# Patient Record
Sex: Female | Born: 1975 | Race: White | Hispanic: No | Smoking: Never smoker
Health system: Southern US, Community
[De-identification: ages and names within clinical notes are randomized; demographics above are authoritative.]

## PROBLEM LIST (undated history)

## (undated) DIAGNOSIS — R61 Generalized hyperhidrosis: Secondary | ICD-10-CM

## (undated) DIAGNOSIS — D259 Leiomyoma of uterus, unspecified: Secondary | ICD-10-CM

## (undated) DIAGNOSIS — T8859XA Other complications of anesthesia, initial encounter: Secondary | ICD-10-CM

## (undated) DIAGNOSIS — G43909 Migraine, unspecified, not intractable, without status migrainosus: Secondary | ICD-10-CM

## (undated) DIAGNOSIS — F419 Anxiety disorder, unspecified: Secondary | ICD-10-CM

## (undated) DIAGNOSIS — Z789 Other specified health status: Secondary | ICD-10-CM

## (undated) DIAGNOSIS — N921 Excessive and frequent menstruation with irregular cycle: Secondary | ICD-10-CM

## (undated) HISTORY — PX: OTHER SURGICAL HISTORY: SHX169

## (undated) HISTORY — DX: Other specified health status: Z78.9

---

## 2003-02-25 HISTORY — PX: DILATION AND CURETTAGE OF UTERUS: SHX78

## 2004-01-29 ENCOUNTER — Ambulatory Visit: Payer: Self-pay | Admitting: Obstetrics & Gynecology

## 2004-03-21 ENCOUNTER — Inpatient Hospital Stay: Payer: Self-pay | Admitting: Obstetrics & Gynecology

## 2005-02-15 ENCOUNTER — Emergency Department: Payer: Self-pay | Admitting: Emergency Medicine

## 2006-07-14 ENCOUNTER — Inpatient Hospital Stay: Payer: Self-pay | Admitting: Unknown Physician Specialty

## 2009-02-24 HISTORY — PX: LEEP: SHX91

## 2009-06-25 LAB — HEPATIC FUNCTION PANEL
ALK PHOS: 62 U/L (ref 25–125)
ALT: 13 U/L (ref 7–35)
AST: 13 U/L (ref 13–35)
Bilirubin, Total: 2.5 mg/dL

## 2009-06-25 LAB — BASIC METABOLIC PANEL
BUN: 13 mg/dL (ref 4–21)
CREATININE: 0.6 mg/dL (ref 0.5–1.1)
Glucose: 94 mg/dL
Potassium: 4 mmol/L (ref 3.4–5.3)
SODIUM: 140 mmol/L (ref 137–147)

## 2009-06-25 LAB — CBC AND DIFFERENTIAL
HCT: 41 % (ref 36–46)
HEMOGLOBIN: 13.3 g/dL (ref 12.0–16.0)
Neutrophils Absolute: 4 /uL
Platelets: 193 10*3/uL (ref 150–399)
WBC: 5.8 10^3/mL

## 2009-07-03 ENCOUNTER — Ambulatory Visit: Payer: Self-pay | Admitting: Family Medicine

## 2015-06-26 ENCOUNTER — Other Ambulatory Visit: Payer: Self-pay | Admitting: Family Medicine

## 2015-09-06 DIAGNOSIS — F419 Anxiety disorder, unspecified: Secondary | ICD-10-CM | POA: Insufficient documentation

## 2015-09-18 ENCOUNTER — Other Ambulatory Visit: Payer: Self-pay | Admitting: Family Medicine

## 2015-09-18 NOTE — Telephone Encounter (Signed)
Let her know I have sent in a refill. Will need an office visit prior to next refill.

## 2015-09-18 NOTE — Telephone Encounter (Signed)
Pt requesting a refill on the following medication, pt use's [facility]  In [place].  Pt states she will be going out of town tomorrow was wondering if this can be ready before she goes out of town or pt would like to know if she could get this refilled at a [facility] where her is going.  Thanks CC   escitalopram (LEXAPRO) 20 MG tablet

## 2015-12-11 ENCOUNTER — Encounter: Payer: Self-pay | Admitting: Family Medicine

## 2015-12-11 ENCOUNTER — Ambulatory Visit (INDEPENDENT_AMBULATORY_CARE_PROVIDER_SITE_OTHER): Payer: BC Managed Care – PPO | Admitting: Family Medicine

## 2015-12-11 VITALS — BP 100/60 | HR 70 | Temp 97.9°F | Resp 16 | Wt 144.6 lb

## 2015-12-11 DIAGNOSIS — F419 Anxiety disorder, unspecified: Secondary | ICD-10-CM

## 2015-12-11 MED ORDER — ESCITALOPRAM OXALATE 20 MG PO TABS: 20.0000 mg | ORAL_TABLET | Freq: Every day | ORAL | 3 refills | Status: DC

## 2015-12-11 MED ORDER — ALPRAZOLAM 0.5 MG PO TABS: 0.5000 mg | ORAL_TABLET | Freq: Every day | ORAL | 1 refills | Status: DC

## 2015-12-11 NOTE — Progress Notes (Signed)
Subjective:     Patient ID: Alisha Hamilton, female   DOB: [birth year], 40 y.o.   MRN: [id number]  HPI  Chief Complaint  Patient presents with  . Anxiety    Patient comes in office today for follow up anxiety, patient states that she was last seen in office for matter in 09/2014. Patient reports good compliance and tolerance while on Lexapro.   States she rarely uses the Xanax once daily. Generally defers health maintenance including flu shot today. 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   Review of Systems  Skin:       History of dystrophic nail which did not improve with Penlac or Vick's. Wishes to try another topical.       Objective:   Physical Exam  Constitutional: She appears well-developed and well-nourished. No distress.  Psychiatric: She has a normal mood and affect. Her behavior is normal.       Assessment:    1. Anxiety - escitalopram (LEXAPRO) 20 MG tablet; Take 1 tablet (20 mg total) by mouth daily.  Dispense: 90 tablet; Refill: 3 - ALPRAZolam (XANAX) 0.5 MG tablet; Take 1 tablet (0.5 mg total) by mouth daily. As needed for anxiety  Dispense: 30 tablet; Refill: 1    Plan:      Discussed trial of [place] for her nails.

## 2015-12-11 NOTE — Patient Instructions (Signed)
Try [place] Tea Tree Oil for your nails.

## 2016-10-07 ENCOUNTER — Encounter: Payer: Self-pay | Admitting: Obstetrics & Gynecology

## 2016-10-07 ENCOUNTER — Ambulatory Visit (INDEPENDENT_AMBULATORY_CARE_PROVIDER_SITE_OTHER): Payer: BC Managed Care – PPO | Admitting: Obstetrics & Gynecology

## 2016-10-07 VITALS — BP 120/80 | HR 80 | Ht 67.0 in | Wt 149.0 lb

## 2016-10-07 DIAGNOSIS — Z1239 Encounter for other screening for malignant neoplasm of breast: Secondary | ICD-10-CM

## 2016-10-07 DIAGNOSIS — Z131 Encounter for screening for diabetes mellitus: Secondary | ICD-10-CM

## 2016-10-07 DIAGNOSIS — Z13220 Encounter for screening for lipoid disorders: Secondary | ICD-10-CM

## 2016-10-07 DIAGNOSIS — Z87410 Personal history of cervical dysplasia: Secondary | ICD-10-CM | POA: Diagnosis not present

## 2016-10-07 DIAGNOSIS — Z1321 Encounter for screening for nutritional disorder: Secondary | ICD-10-CM | POA: Diagnosis not present

## 2016-10-07 DIAGNOSIS — Z0000 Encounter for general adult medical examination without abnormal findings: Secondary | ICD-10-CM | POA: Diagnosis not present

## 2016-10-07 DIAGNOSIS — Z1231 Encounter for screening mammogram for malignant neoplasm of breast: Secondary | ICD-10-CM | POA: Diagnosis not present

## 2016-10-07 DIAGNOSIS — Z1329 Encounter for screening for other suspected endocrine disorder: Secondary | ICD-10-CM | POA: Diagnosis not present

## 2016-10-07 NOTE — Patient Instructions (Signed)
PAP every 2-3 years if normal Mammogram every year    Call [phone] to schedule at [facility]) Labs yearly to every 3 years

## 2016-10-07 NOTE — Progress Notes (Signed)
HPI:      Alisha Hamilton is a 41 y.o. [id number] who LMP was Patient's last menstrual period was 09/23/2016., she presents today for her annual examination. The patient has no complaints today. The patient is sexually active. Her last pap: approximate date 2016 and was normal and prior h/o LEEP 2011. The patient does perform self breast exams.  There is no notable family history of breast or ovarian cancer in her family.  The patient has regular exercise: yes.  The patient denies current symptoms of depression.    GYN History: Contraception: IUD  PMHx: History reviewed. No pertinent past medical history. Past Surgical History:  Procedure Laterality Date  . DILATION AND CURETTAGE OF UTERUS  2005  . LEEP  2011  . OTHER SURGICAL HISTORY     clips put on nerves 2006   Family History  Problem Relation Age of Onset  . Diabetes Father   . Anxiety disorder Sister   . Irritable bowel syndrome Sister   . Alcohol abuse Brother   . Hypertension Brother   . Hyperlipidemia Brother   . Anxiety disorder Brother    Social History  Substance Use Topics  . Smoking status: Never Smoker  . Smokeless tobacco: Never Used  . Alcohol use Yes    Current Outpatient Prescriptions:  .  ALPRAZolam (XANAX) 0.5 MG tablet, Take 1 tablet (0.5 mg total) by mouth daily. As needed for anxiety, Disp: 30 tablet, Rfl: 1 .  escitalopram (LEXAPRO) 20 MG tablet, Take 1 tablet (20 mg total) by mouth daily., Disp: 90 tablet, Rfl: 3 .  ferrous fumarate (FERRO-SEQUELS) 18 MG CR tablet, Take by mouth., Disp: , Rfl:  Allergies: Patient has no known allergies.  Review of Systems  Constitutional: Negative for chills, fever and malaise/fatigue.  HENT: Negative for congestion, sinus pain and sore throat.   Eyes: Negative for blurred vision and pain.  Respiratory: Negative for cough and wheezing.   Cardiovascular: Negative for chest pain and leg swelling.  Gastrointestinal: Negative for abdominal pain, constipation,  diarrhea, heartburn, nausea and vomiting.  Genitourinary: Negative for dysuria, frequency, hematuria and urgency.  Musculoskeletal: Negative for back pain, joint pain, myalgias and neck pain.  Skin: Negative for itching and rash.  Neurological: Negative for dizziness, tremors and weakness.  Endo/Heme/Allergies: Does not bruise/bleed easily.  Psychiatric/Behavioral: Negative for depression. The patient is not nervous/anxious and does not have insomnia.     Objective: BP 120/80   Pulse 80   Ht 5\' 7"  (1.702 m)   Wt 149 lb (67.6 kg)   LMP 09/23/2016   BMI 23.34 kg/m   Filed Weights   10/07/16 0817  Weight: 149 lb (67.6 kg)   Body mass index is 23.34 kg/m. Physical Exam  Constitutional: She is oriented to person, place, and time. She appears well-developed and well-nourished. No distress.  Genitourinary: Rectum normal, vagina normal and uterus normal. Pelvic exam was performed with patient supine. There is no rash or lesion on the right labia. There is no rash or lesion on the left labia. Vagina exhibits no lesion. No bleeding in the vagina. Right adnexum does not display mass and does not display tenderness. Left adnexum does not display mass and does not display tenderness. Cervix does not exhibit motion tenderness, lesion, friability or polyp.   Uterus is mobile and anteverted. Uterus is not enlarged or exhibiting a mass.  Genitourinary Comments: IUD strings 1 cm  HENT:  Head: Normocephalic and atraumatic. Head is without laceration.  Right  Ear: Hearing normal.  Left Ear: Hearing normal.  Nose: No epistaxis.  No foreign bodies.  Mouth/Throat: Uvula is midline, oropharynx is clear and moist and mucous membranes are normal.  Eyes: Pupils are equal, round, and reactive to light.  Neck: Normal range of motion. Neck supple. No thyromegaly present.  Cardiovascular: Normal rate and regular rhythm.  Exam reveals no gallop and no friction rub.   No murmur heard. Pulmonary/Chest: Effort  normal and breath sounds normal. No respiratory distress. She has no wheezes. Right breast exhibits no mass, no skin change and no tenderness. Left breast exhibits no mass, no skin change and no tenderness.  Abdominal: Soft. Bowel sounds are normal. She exhibits no distension. There is no tenderness. There is no rebound.  Musculoskeletal: Normal range of motion.  Neurological: She is alert and oriented to person, place, and time. No cranial nerve deficit.  Skin: Skin is warm and dry.  Psychiatric: She has a normal mood and affect. Judgment normal.  Vitals reviewed.   Assessment:  ANNUAL EXAM 1. Annual physical exam   2. Screening for breast cancer   3. History of cervical dysplasia   4. Screening for thyroid disorder   5. Screening for diabetes mellitus   6. Screening for cholesterol level   7. Encounter for vitamin deficiency screening      Screening Plan:            1.  Cervical Screening-  Pap smear done today  2. Breast screening- Exam annually and mammogram>40 planned   3. Colonoscopy every 10 years, Hemoccult testing - after age 76  4. Labs To return fasting at a later date  5. Counseling for contraception: IUD  Other:  1. Annual physical exam  2. Screening for breast cancer - MM DIGITAL SCREENING BILATERAL; Future  3. History of cervical dysplasia - IGP, Aptima HPV  4. Screening for thyroid disorder - TSH; Future  5. Screening for diabetes mellitus - Glucose, fasting; Future  6. Screening for cholesterol level - Lipid panel; Future  7. Encounter for vitamin deficiency screening - VITAMIN D 25 Hydroxy (Vit-D Deficiency, Fractures); Future  8. Needs IUD exchange as has been 5 years w Mirena; min periods, no desire for pregnancy     F/U  Return in about 3 weeks (around 10/28/2016) for IUD APPT, Labs.  Barnett Applebaum, MD, [facility], [facility] 10/07/2016  8:43 AM

## 2016-10-10 LAB — IGP, APTIMA HPV
HPV APTIMA: NEGATIVE
PAP Smear Comment: 0

## 2016-12-02 ENCOUNTER — Other Ambulatory Visit: Payer: Self-pay | Admitting: Family Medicine

## 2016-12-02 DIAGNOSIS — F419 Anxiety disorder, unspecified: Secondary | ICD-10-CM

## 2016-12-02 NOTE — Telephone Encounter (Signed)
Please schedule her to come in for an office visit prior to refilling medication

## 2016-12-02 NOTE — Telephone Encounter (Signed)
[id number] 

## 2016-12-08 ENCOUNTER — Telehealth: Payer: Self-pay | Admitting: Family Medicine

## 2016-12-08 ENCOUNTER — Encounter: Payer: Self-pay | Admitting: Family Medicine

## 2016-12-08 ENCOUNTER — Ambulatory Visit (INDEPENDENT_AMBULATORY_CARE_PROVIDER_SITE_OTHER): Payer: BC Managed Care – PPO | Admitting: Family Medicine

## 2016-12-08 DIAGNOSIS — Z23 Encounter for immunization: Secondary | ICD-10-CM | POA: Diagnosis not present

## 2016-12-08 DIAGNOSIS — F419 Anxiety disorder, unspecified: Secondary | ICD-10-CM

## 2016-12-08 MED ORDER — ALPRAZOLAM 0.5 MG PO TABS: 0.5000 mg | ORAL_TABLET | Freq: Every day | ORAL | 0 refills | Status: DC

## 2016-12-08 MED ORDER — ESCITALOPRAM OXALATE 20 MG PO TABS: 20.0000 mg | ORAL_TABLET | Freq: Every day | ORAL | 3 refills | Status: DC

## 2016-12-08 NOTE — Progress Notes (Signed)
Subjective:     Patient ID: Alisha Hamilton, female   DOB: [birth year], 41 y.o.   MRN: [id number]  HPI  Chief Complaint  Patient presents with  . Annual Exam    Patient comes in office today for her annual physical she states she is feeling well and has no questions or concerns to address. Patient reports she follows a well balanced diet, sleeping on average 7hrs a night and is not actively exericising. Patient states that she does monthly self breast checks and there has been no changes. Most recent pap was 10/07/16, mammogram and labs were ordered on same date but patient plans on having those done after christmas holiday. Patient declined flu vaccine today.   On review of the chart she has had prior complete physical with ob-gyn in August and is pending labs. She wishes refill on medication for anxiety. Rarely uses the Xanax except for high stress situations like presentations. After further discussion agrees to get influenza vaccine. Currently is a fourth grade teacher.  Review of Systems     Objective:   Physical Exam  Constitutional: She appears well-developed and well-nourished. No distress.  Psychiatric: She has a normal mood and affect. Her behavior is normal.       Assessment:    1. Anxiety - escitalopram (LEXAPRO) 20 MG tablet; Take 1 tablet (20 mg total) by mouth daily.  Dispense: 90 tablet; Refill: 3 - ALPRAZolam (XANAX) 0.5 MG tablet; Take 1 tablet (0.5 mg total) by mouth daily. As needed for anxiety  Dispense: 30 tablet; Refill: 0  2. Need for influenza vaccination - Flu Vaccine QUAD 36+ mos IM    Plan:    Further f/u when labs completed

## 2016-12-08 NOTE — Patient Instructions (Signed)
Let me know when you get your labs done so I can review them.

## 2016-12-08 NOTE — Telephone Encounter (Signed)
Pt contacted office for refill request on the following medications:  escitalopram (LEXAPRO) 20 MG tablet.  Take 1 tablet by mouth daily.  90 day supply.  [facility] [place].

## 2017-06-24 ENCOUNTER — Other Ambulatory Visit: Payer: Self-pay | Admitting: Obstetrics & Gynecology

## 2017-06-24 ENCOUNTER — Telehealth: Payer: Self-pay | Admitting: Obstetrics & Gynecology

## 2017-06-24 DIAGNOSIS — Z1231 Encounter for screening mammogram for malignant neoplasm of breast: Secondary | ICD-10-CM

## 2017-06-24 NOTE — Telephone Encounter (Signed)
Patient is schedule 06/25/17 with [facility] for mirena removal and reinsertion

## 2017-06-24 NOTE — Telephone Encounter (Signed)
Noted. Shipment to arrive 06/25/17 ~noon-2pm

## 2017-06-25 ENCOUNTER — Ambulatory Visit: Payer: BC Managed Care – PPO | Admitting: Obstetrics & Gynecology

## 2017-06-25 ENCOUNTER — Encounter: Payer: Self-pay | Admitting: Obstetrics & Gynecology

## 2017-06-25 VITALS — BP 112/80 | HR 70 | Ht 67.0 in | Wt 145.0 lb

## 2017-06-25 DIAGNOSIS — Z30433 Encounter for removal and reinsertion of intrauterine contraceptive device: Secondary | ICD-10-CM

## 2017-06-25 DIAGNOSIS — N926 Irregular menstruation, unspecified: Secondary | ICD-10-CM

## 2017-06-25 LAB — POCT URINE PREGNANCY: Preg Test, Ur: NEGATIVE

## 2017-06-25 NOTE — Progress Notes (Signed)
  History of Present Illness:  Alisha Hamilton is a 42 y.o. that had a Mirena IUD placed approximately 5 years ago. Since that time, she states that some BTB as it has been > 5 years.  Desires another IUD.  The following portions of the patient's history were reviewed and updated as appropriate: allergies, current medications, past family history, past medical history, past social history, past surgical history and problem list.  Patient Active Problem List   Diagnosis Date Noted  . History of cervical dysplasia 10/07/2016  . Anxiety 09/06/2015  . Acute onset aura migraine 08/04/2009  . 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 ALPRAZolam (XANAX) 0.5 MG tablet Take 1 tablet (0.5 mg total) by mouth daily. As needed for anxiety 30 tablet 0  . escitalopram (LEXAPRO) 20 MG tablet Take 1 tablet (20 mg total) by mouth daily. 90 tablet 3  . ferrous fumarate (FERRO-SEQUELS) 18 MG CR tablet Take by mouth.     No current facility-administered medications on file prior to visit.    Allergies: has No Known Allergies.  Physical Exam:  BP 112/80   Pulse 70   Ht 5\' 7"  (1.702 m)   Wt 145 lb (65.8 kg)   BMI 22.71 kg/m  Body mass index is 22.71 kg/m. Constitutional: Well nourished, well developed female in no acute distress.  Abdomen: diffusely non tender to palpation, non distended, and no masses, hernias Neuro: Grossly intact Psych:  Normal mood and affect.    Pelvic exam:  Two IUD strings present seen coming from the cervical os. EGBUS, vaginal vault and cervix: within normal limits  uCG Negative  IUD Removal Strings of IUD identified and grasped.  IUD removed without problem.  Pt tolerated this well.  IUD noted to be intact.  IUD Insertion Procedure Note Patient identified, informed consent performed, consent signed.   Discussed risks of irregular bleeding, cramping, infection,  malpositioning or misplacement of the IUD outside the uterus which may require further procedure such as laparoscopy, risk of failure <1%. Time out was performed.  Urine pregnancy test negative.  A bimanual exam showed the uterus to be midposition.  Speculum placed in the vagina.  Cervix visualized.  Cleaned with Betadine x 2.  Grasped anteriorly with a single tooth tenaculum.  Uterus sounded to 7 cm.   IUD placed per manufacturer's recommendations.  Strings trimmed to 3 cm. Tenaculum was removed, good hemostasis noted.  Patient tolerated procedure well.   Assessment: IUD Removal and Re-Insertion For Birth Control and period Control of Menorrhagia and Dysmenorrhea  Plan: Patient was given post-procedure instructions.  She was advised to have backup contraception for one week.  Patient was also asked to check IUD strings periodically and follow up in 4 weeks for IUD check.  Will plan Annual (no PAP needed) w labs in June [facility] June 13 already  Barnett Applebaum, M.D. 06/25/2017 3:51 PM

## 2017-06-25 NOTE — Patient Instructions (Signed)

## 2017-07-09 NOTE — Telephone Encounter (Signed)
Mirena received & charged 06/25/17

## 2017-08-06 ENCOUNTER — Ambulatory Visit
Admission: RE | Admit: 2017-08-06 | Discharge: 2017-08-06 | Disposition: A | Discharge status: Home / Self Care | Payer: BC Managed Care – PPO | Source: Ambulatory Visit | Attending: Obstetrics & Gynecology | Admitting: Obstetrics & Gynecology

## 2017-08-06 DIAGNOSIS — Z1231 Encounter for screening mammogram for malignant neoplasm of breast: Secondary | ICD-10-CM | POA: Diagnosis present

## 2017-08-10 ENCOUNTER — Ambulatory Visit: Payer: BC Managed Care – PPO | Admitting: Obstetrics & Gynecology

## 2017-12-02 ENCOUNTER — Other Ambulatory Visit: Payer: Self-pay | Admitting: Family Medicine

## 2017-12-02 DIAGNOSIS — F419 Anxiety disorder, unspecified: Secondary | ICD-10-CM

## 2017-12-04 ENCOUNTER — Ambulatory Visit: Payer: BC Managed Care – PPO | Admitting: Family Medicine

## 2017-12-04 ENCOUNTER — Encounter: Payer: Self-pay | Admitting: Family Medicine

## 2017-12-04 VITALS — BP 110/80 | HR 76 | Temp 98.6°F | Resp 16 | Wt 151.2 lb

## 2017-12-04 DIAGNOSIS — F419 Anxiety disorder, unspecified: Secondary | ICD-10-CM | POA: Diagnosis not present

## 2017-12-04 MED ORDER — ALPRAZOLAM 0.5 MG PO TABS: 0.5000 mg | ORAL_TABLET | Freq: Every day | ORAL | 0 refills | Status: DC

## 2017-12-04 NOTE — Patient Instructions (Signed)
Do schedule  your physical with Dr. Kenton Kingfisher.

## 2017-12-04 NOTE — Progress Notes (Signed)
  Subjective:     Patient ID: Alisha Hamilton, female   DOB: [birth year], 42 y.o.   MRN: [id number] Chief Complaint  Patient presents with  . Anxiety    Patient comes in office today for follow up visit, last office visit was 12/10/16 and patient was advised to continue on Lexapro and Alprazolam. Patient reports that her condition is stable and has had good compliance and tolerane on medication.    HPI Wishes refill on alprazolam. 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 She is pending her annual physical with gym, Dr. Kenton Kingfisher, who updates her labs.  Review of Systems     Objective:   Physical Exam  Constitutional: She appears well-developed and well-nourished. No distress.  Psychiatric: She has a normal mood and affect. Her behavior is normal.       Assessment:    1. Anxiety: well controlled on Lexapro. - ALPRAZolam (XANAX) 0.5 MG tablet; Take 1 tablet (0.5 mg total) by mouth daily. As needed for anxiety  Dispense: 30 tablet; Refill: 0    Plan:    Do f/u with Dr. Kenton Kingfisher.

## 2018-02-27 ENCOUNTER — Other Ambulatory Visit: Payer: Self-pay | Admitting: Family Medicine

## 2018-02-27 DIAGNOSIS — F419 Anxiety disorder, unspecified: Secondary | ICD-10-CM

## 2018-09-28 ENCOUNTER — Other Ambulatory Visit: Payer: Self-pay | Admitting: Obstetrics & Gynecology

## 2018-09-28 ENCOUNTER — Other Ambulatory Visit: Payer: Self-pay | Admitting: Family Medicine

## 2018-09-28 DIAGNOSIS — Z1231 Encounter for screening mammogram for malignant neoplasm of breast: Secondary | ICD-10-CM

## 2018-10-12 ENCOUNTER — Ambulatory Visit
Admission: RE | Admit: 2018-10-12 | Discharge: 2018-10-12 | Disposition: A | Discharge status: Home / Self Care | Payer: BC Managed Care – PPO | Source: Ambulatory Visit | Attending: Obstetrics & Gynecology | Admitting: Obstetrics & Gynecology

## 2018-10-12 ENCOUNTER — Other Ambulatory Visit: Payer: Self-pay

## 2018-10-12 DIAGNOSIS — Z1231 Encounter for screening mammogram for malignant neoplasm of breast: Secondary | ICD-10-CM | POA: Diagnosis present

## 2018-10-13 ENCOUNTER — Other Ambulatory Visit: Payer: Self-pay | Admitting: Obstetrics & Gynecology

## 2018-10-13 DIAGNOSIS — N6320 Unspecified lump in the left breast, unspecified quadrant: Secondary | ICD-10-CM

## 2018-10-13 DIAGNOSIS — R928 Other abnormal and inconclusive findings on diagnostic imaging of breast: Secondary | ICD-10-CM

## 2018-10-13 DIAGNOSIS — N6489 Other specified disorders of breast: Secondary | ICD-10-CM

## 2018-10-13 NOTE — Progress Notes (Signed)
LM, to discuss upon call back  The results of your recent mammogram reveals an area that needs further magnification views to determine if there is any concern or if it is just a false alarm. There is no suggestion of cancer, just a need for further images and evaluation by the radiologist. They should be contacting you, if not already, to schedule these additional mammogram pictures. If you have any questions, or if they have not yet contacted you, then please give [place] a call at [phone], so that we may help in this process. I know this seems worrisome, yet usually additional xrays clear up any suspicion for breast cancer.

## 2018-10-22 ENCOUNTER — Ambulatory Visit
Admission: RE | Admit: 2018-10-22 | Discharge: 2018-10-22 | Disposition: A | Discharge status: Home / Self Care | Payer: BC Managed Care – PPO | Source: Ambulatory Visit | Attending: Obstetrics & Gynecology | Admitting: Obstetrics & Gynecology

## 2018-10-22 DIAGNOSIS — N6489 Other specified disorders of breast: Secondary | ICD-10-CM

## 2018-10-22 DIAGNOSIS — N6320 Unspecified lump in the left breast, unspecified quadrant: Secondary | ICD-10-CM

## 2018-10-22 DIAGNOSIS — R928 Other abnormal and inconclusive findings on diagnostic imaging of breast: Secondary | ICD-10-CM | POA: Diagnosis present

## 2018-12-21 ENCOUNTER — Telehealth: Payer: Self-pay | Admitting: Family Medicine

## 2018-12-21 NOTE — Telephone Encounter (Signed)
[facility] faxed refill request for the following medications:  ALPRAZolam (XANAX) 0.5 MG tablet  Last Rx: 12/04/2017 LOV: 12/04/2017 with Mikki Santee Pt was seeing Mikki Santee and hasn't scheduled an OV with anyone else in the office to est care. Please advise. Thanks TNP

## 2018-12-21 NOTE — Telephone Encounter (Signed)
LVMTRC to schedule appointment with someone in the office.

## 2018-12-28 ENCOUNTER — Telehealth (INDEPENDENT_AMBULATORY_CARE_PROVIDER_SITE_OTHER): Payer: BC Managed Care – PPO | Admitting: Physician Assistant

## 2018-12-28 DIAGNOSIS — J029 Acute pharyngitis, unspecified: Secondary | ICD-10-CM

## 2018-12-28 MED ORDER — AMOXICILLIN 875 MG PO TABS: 875.0000 mg | ORAL_TABLET | Freq: Two times a day (BID) | ORAL | 0 refills | Status: AC

## 2018-12-28 NOTE — Progress Notes (Signed)
Patient: Alisha Hamilton Female    DOB: May 13, 1975   43 y.o.   MRN: [id number] Visit Date: 12/28/2018  Today's Provider: Trinna Post, PA-C   Chief Complaint  Patient presents with  . Sore Throat   Subjective:    I, Porsha McClurkin CMA, am acting as a Education administrator for [employer].   Virtual Visit via Video Note  I connected with Alisha Hamilton on 12/28/18 at  2:40 PM EST by a video enabled telemedicine application and verified that I am speaking with the correct person using two identifiers.  Location: Patient: Home Provider: Office   I discussed the limitations of evaluation and management by telemedicine and the availability of in person appointments. The patient expressed understanding and agreed to proceed.  Sore Throat  This is a new problem. The current episode started in the past 7 days. The problem has been unchanged. There has been no fever. The pain is at a severity of 7/10. The pain is moderate. Pertinent negatives include no congestion, coughing or swollen glands. She has tried NSAIDs and gargles for the symptoms. The treatment provided mild relief.   Patient states that she have blisters in the back of her mouth and it burns when she swallows. Patient states it feels like she is swallowing glass. Denies contact with suspected or confirmed COVID. Reports she had a positive strep test at the time. This feels similar.     No Known Allergies   Current Outpatient Medications:  .  ALPRAZolam (XANAX) 0.5 MG tablet, Take 1 tablet (0.5 mg total) by mouth daily. As needed for anxiety, Disp: 30 tablet, Rfl: 0 .  escitalopram (LEXAPRO) 20 MG tablet, TAKE 1 TABLET BY MOUTH DAILY, Disp: 90 tablet, Rfl: 3 .  ferrous fumarate (FERRO-SEQUELS) 18 MG CR tablet, Take by mouth., Disp: , Rfl:   Review of Systems  Constitutional: Negative.   HENT: Positive for sore throat. Negative for congestion.   Respiratory: Negative.  Negative for cough.   Endocrine: Negative.      Social History   Tobacco Use  . Smoking status: Never Smoker  . Smokeless tobacco: Never Used  Substance Use Topics  . Alcohol use: Yes      Objective:   There were no vitals taken for this visit. There were no vitals filed for this visit.There is no [place] or weight on file to calculate BMI.   Physical Exam Constitutional:      General: She is not in acute distress.    Appearance: She is well-developed. She is not ill-appearing or toxic-appearing.  Pulmonary:     Effort: Pulmonary effort is normal. No respiratory distress.  Neurological:     Mental Status: She is alert.  Psychiatric:        Mood and Affect: Mood normal.        Behavior: Behavior normal.      No results found for any visits on 12/28/18.     Assessment & Plan    1. Sore throat  Counseled on viral vs bacterial causes of sore throat. Will send in abx to cover for strep and sore throat has been severe and continuous but may wait 1-2 days and assess for improvement.  - amoxicillin (AMOXIL) 875 MG tablet; Take 1 tablet (875 mg total) by mouth 2 (two) times daily for 7 days.  Dispense: 14 tablet; Refill: 0 - Novel Coronavirus, NAA ([facility])  The entirety of the information documented in the History of Present  Illness, Review of Systems and Physical Exam were personally obtained by me. Portions of this information were initially documented by [facility], CMA and reviewed by me for thoroughness and accuracy.   F/u PRN      Trinna Post, PA-C  [facility]

## 2019-03-01 ENCOUNTER — Telehealth: Payer: Self-pay | Admitting: Physician Assistant

## 2019-03-01 DIAGNOSIS — F419 Anxiety disorder, unspecified: Secondary | ICD-10-CM

## 2019-03-01 MED ORDER — ESCITALOPRAM OXALATE 20 MG PO TABS: 20.0000 mg | ORAL_TABLET | Freq: Every day | ORAL | 1 refills | Status: DC

## 2019-03-01 NOTE — Telephone Encounter (Signed)
[facility] refill request for the following medications:   escitalopram (LEXAPRO) 20 MG tablet   Please advise.  

## 2019-03-01 NOTE — Telephone Encounter (Signed)
Will need follow up in the next few months to assess lexapro. Refilled 90 days with 1 refill until then.

## 2019-03-03 NOTE — Telephone Encounter (Signed)
Left voicemail advising patient per DPR. Patient was advised to call back and schedule appointment with Fabio Bering. If patient returns call [facility] can schedule patient appointment.

## 2019-04-20 ENCOUNTER — Encounter: Payer: Self-pay | Admitting: Advanced Practice Midwife

## 2019-04-20 ENCOUNTER — Other Ambulatory Visit (HOSPITAL_COMMUNITY)
Admission: RE | Admit: 2019-04-20 | Discharge: 2019-04-20 | Disposition: A | Discharge status: Home / Self Care | Payer: BC Managed Care – PPO | Source: Ambulatory Visit | Attending: Advanced Practice Midwife | Admitting: Advanced Practice Midwife

## 2019-04-20 ENCOUNTER — Ambulatory Visit (INDEPENDENT_AMBULATORY_CARE_PROVIDER_SITE_OTHER): Payer: BC Managed Care – PPO | Admitting: Advanced Practice Midwife

## 2019-04-20 ENCOUNTER — Other Ambulatory Visit: Payer: Self-pay

## 2019-04-20 VITALS — BP 120/80 | HR 70 | Ht 67.0 in | Wt 153.0 lb

## 2019-04-20 DIAGNOSIS — Z01411 Encounter for gynecological examination (general) (routine) with abnormal findings: Secondary | ICD-10-CM

## 2019-04-20 DIAGNOSIS — Z124 Encounter for screening for malignant neoplasm of cervix: Secondary | ICD-10-CM

## 2019-04-20 DIAGNOSIS — N898 Other specified noninflammatory disorders of vagina: Secondary | ICD-10-CM

## 2019-04-20 DIAGNOSIS — Z113 Encounter for screening for infections with a predominantly sexual mode of transmission: Secondary | ICD-10-CM

## 2019-04-20 DIAGNOSIS — Z01419 Encounter for gynecological examination (general) (routine) without abnormal findings: Secondary | ICD-10-CM | POA: Diagnosis present

## 2019-04-20 MED ORDER — FLUCONAZOLE 150 MG PO TABS: 150.0000 mg | ORAL_TABLET | Freq: Once | ORAL | 3 refills | Status: AC

## 2019-04-20 NOTE — Progress Notes (Signed)
Gynecology Annual Exam  PCP: Trinna Post, PA-C  Chief Complaint:  Chief Complaint  Patient presents with  . Gynecologic Exam    STD testing per pt request  . Vaginitis    vaginal itching    History of Present Illness: Patient is a 44 y.o. [id number] presents for annual exam. The patient has complaint today of symptoms of yeast infection. She has itching that started about 2 days ago. She attributes yeast infections to night sweats. She had recent unprotected sex with new partner and does not know his history. She requests STD screening today. We discussed doing PAP smear/HPV today also due to new partner.   LMP: No LMP recorded (exact date). (Menstrual status: IUD). Occasional spotting Intermenstrual Bleeding: not applicable Postcoital Bleeding: no Dysmenorrhea: not applicable   The patient is sexually active. She currently uses IUD for contraception. She denies dyspareunia.  The patient does perform self breast exams.  There is no notable family history of breast or ovarian cancer in her family.  The patient wears seatbelts: yes.   The patient has regular exercise: she is regularly physically active. She admits healthy diet, adequate hydration and adequate sleep.    The patient denies current symptoms of depression.    Review of Systems: Review of Systems  Constitutional: Negative.   HENT: Negative.   Eyes: Negative.   Respiratory: Negative.   Cardiovascular: Negative.   Gastrointestinal: Negative.   Genitourinary: Negative.   Musculoskeletal: Negative.   Skin: Negative.   Neurological: Negative.   Endo/Heme/Allergies: Negative.   Psychiatric/Behavioral: Negative.     Past Medical History:  History reviewed. No pertinent past medical history.  Past Surgical History:  Past Surgical History:  Procedure Laterality Date  . DILATION AND CURETTAGE OF UTERUS  2005  . LEEP  2011  . OTHER SURGICAL HISTORY     clips put on nerves 2006    Gynecologic History:  No  LMP recorded (exact date). (Menstrual status: IUD). Contraception: IUD Last Pap: 2.5 years ago Results were:  no abnormalities  Last mammogram: 6 months ago Results were: BI-RAD II  Obstetric [id number]  Family History:  Family History  Problem Relation Age of Onset  . Diabetes Father   . Anxiety disorder Sister   . Irritable bowel syndrome Sister   . Alcohol abuse Brother   . Hypertension Brother   . Hyperlipidemia Brother   . Anxiety disorder Brother   . Breast cancer Neg Hx     Social History:  Social History   Socioeconomic History  . Marital status: Divorced    Spouse name: Not on file  . Number of children: Not on file  . Years of education: Not on file  . Highest education level: Not on file  Occupational History  . Not on file  Tobacco Use  . Smoking status: Never Smoker  . Smokeless tobacco: Never Used  Substance and Sexual Activity  . Alcohol use: Yes  . Drug use: No  . Sexual activity: Yes    Birth control/protection: I.U.D.  Other Topics Concern  . Not on file  Social History Narrative  . Not on file   Social Determinants of Health   Financial Resource Strain:   . Difficulty of Paying Living Expenses: Not on file  Food Insecurity:   . Worried About Charity fundraiser in the Last Year: Not on file  . Ran Out of Food in the Last Year: Not on file  Transportation Needs:   .  Lack of Transportation (Medical): Not on file  . Lack of Transportation (Non-Medical): Not on file  Physical Activity:   . Days of Exercise per Week: Not on file  . Minutes of Exercise per Session: Not on file  Stress:   . Feeling of Stress : Not on file  Social Connections:   . Frequency of Communication with Friends and Family: Not on file  . Frequency of Social Gatherings with Friends and Family: Not on file  . Attends Religious Services: Not on file  . Active Member of Clubs or Organizations: Not on file  . Attends Archivist Meetings: Not on file  .  Marital Status: Not on file  Intimate Partner Violence:   . Fear of Current or Ex-Partner: Not on file  . Emotionally Abused: Not on file  . Physically Abused: Not on file  . Sexually Abused: Not on file    Allergies:  No Known Allergies  Medications: Prior to Admission medications   Medication Sig Start Date End Date Taking? Authorizing Provider  ALPRAZolam Duanne Moron) 0.5 MG tablet Take 1 tablet (0.5 mg total) by mouth daily. As needed for anxiety 12/04/17  Yes Carmon Ginsberg, PA  escitalopram (LEXAPRO) 20 MG tablet Take 1 tablet (20 mg total) by mouth daily. 03/01/19  Yes Trinna Post, PA-C  fluconazole (DIFLUCAN) 150 MG tablet Take 1 tablet (150 mg total) by mouth once for 1 dose. Can take additional dose three days later if symptoms persist 04/20/19 04/20/19  Rod Can, CNM    Physical Exam Vitals: Blood pressure 120/80, pulse 70, [place] 5\' 7"  (1.702 m), weight 153 lb (69.4 kg).  General: NAD HEENT: normocephalic, anicteric Thyroid: no enlargement, no palpable nodules Pulmonary: No increased work of breathing, CTAB Cardiovascular: RRR, distal pulses 2+ Breast: Breast symmetrical, no tenderness, no palpable nodules or masses, no skin or nipple retraction present, no nipple discharge.  No axillary or supraclavicular lymphadenopathy. Abdomen: NABS, soft, non-tender, non-distended.  Umbilicus without lesions.  No hepatomegaly, splenomegaly or masses palpable. No evidence of hernia  Genitourinary:  External: Normal external female genitalia.  Normal urethral meatus, normal Bartholin's and Skene's glands.    Vagina: Normal vaginal mucosa, no evidence of prolapse.    Cervix: Grossly normal in appearance, scant brown bleeding, no CMT, IUD strings visualized  Uterus: Non-enlarged, mobile, normal contour.    Adnexa: ovaries non-enlarged, no adnexal masses  Rectal: deferred  Lymphatic: no evidence of inguinal lymphadenopathy Extremities: no edema, erythema, or  tenderness Neurologic: Grossly intact Psychiatric: mood appropriate, affect full    Assessment: 44 y.o. [id number] routine annual exam  Plan: Problem List Items Addressed This Visit    None    Visit Diagnoses    Well woman exam with routine gynecological exam    -  Primary   Relevant Orders   Cytology - PAP   Cervicovaginal ancillary only   Hepatitis B surface antibody,qualitative   HIV Antibody (routine testing w rflx)   RPR Qual   Vagina itching       Relevant Medications   fluconazole (DIFLUCAN) 150 MG tablet   Other Relevant Orders   Cervicovaginal ancillary only   Screen for sexually transmitted diseases       Relevant Orders   Cytology - PAP   Hepatitis B surface antibody,qualitative   HIV Antibody (routine testing w rflx)   RPR Qual   Cervical cancer screening       Relevant Orders   Cytology - PAP      1)  Mammogram - recommend yearly screening mammogram.  Mammogram Is up to date  2) STI screening  was offered and accepted  3) ASCCP guidelines and rationale discussed.  Patient opts for every 3 years screening interval. PAP done today due to unprotected intercourse with new partner.  4) Contraception - the patient is currently using  IUD.  She is happy with her current form of contraception and plans to continue  5) Colonoscopy -- Screening recommended starting at age 73 for average risk individuals, age 23 for individuals deemed at increased risk (including African Americans) and recommended to continue until age 28.  For patient age 48-85 individualized approach is recommended.  Gold standard screening is via colonoscopy, Cologuard screening is an acceptable alternative for patient unwilling or unable to undergo colonoscopy.  "Colorectal cancer screening for average?risk adults: 2018 guideline update from the American Cancer Society"CA: A Cancer Journal for Clinicians: Jul 23, 2016   6) Routine healthcare maintenance including cholesterol, diabetes screening  discussed managed by PCP  7) Return in about 1 year (around 04/19/2020) for annual established gyn.   Rod Can, [facility] 04/20/2019, 4:15 PM

## 2019-04-22 LAB — CYTOLOGY - PAP
Chlamydia: NEGATIVE
Comment: NEGATIVE
Comment: NEGATIVE
Comment: NEGATIVE
Comment: NORMAL
Diagnosis: NEGATIVE
High risk HPV: NEGATIVE
Neisseria Gonorrhea: NEGATIVE
Trichomonas: NEGATIVE

## 2019-04-23 ENCOUNTER — Ambulatory Visit: Payer: BC Managed Care – PPO | Attending: Internal Medicine

## 2019-04-23 ENCOUNTER — Ambulatory Visit: Payer: BC Managed Care – PPO

## 2019-04-23 DIAGNOSIS — Z23 Encounter for immunization: Secondary | ICD-10-CM

## 2019-04-23 NOTE — Progress Notes (Signed)
   Covid-19 Vaccination Clinic  Name:  Alisha Hamilton    MRN: [id number] DOB: [birth year]  04/23/2019  [name] was observed post Covid-19 immunization for 15 minutes without incidence. She was provided with Vaccine Information Sheet and instruction to access the V-Safe system.   [name] was instructed to call 911 with any severe reactions post vaccine: Marland Kitchen Difficulty breathing  . Swelling of your face and throat  . A fast heartbeat  . A bad rash all over your body  . Dizziness and weakness    Immunizations Administered    Name Date Dose VIS Date Route   Moderna COVID-19 Vaccine 04/23/2019  1:29 PM 0.5 mL 01/25/2019 Intramuscular   Manufacturer: Moderna   Lot: [id number]   [id number]

## 2019-04-26 LAB — CERVICOVAGINAL ANCILLARY ONLY
Bacterial Vaginitis (gardnerella): NEGATIVE
Candida Glabrata: NEGATIVE
Candida Vaginitis: POSITIVE — AB
Comment: NEGATIVE
Comment: NEGATIVE
Comment: NEGATIVE

## 2019-04-29 ENCOUNTER — Other Ambulatory Visit
Admission: RE | Admit: 2019-04-29 | Discharge: 2019-04-29 | Disposition: A | Discharge status: Home / Self Care | Payer: BC Managed Care – PPO | Attending: Advanced Practice Midwife | Admitting: Advanced Practice Midwife

## 2019-04-29 ENCOUNTER — Other Ambulatory Visit: Payer: Self-pay

## 2019-04-29 DIAGNOSIS — Z01419 Encounter for gynecological examination (general) (routine) without abnormal findings: Secondary | ICD-10-CM | POA: Insufficient documentation

## 2019-04-29 DIAGNOSIS — Z113 Encounter for screening for infections with a predominantly sexual mode of transmission: Secondary | ICD-10-CM | POA: Diagnosis not present

## 2019-04-29 LAB — HIV ANTIBODY (ROUTINE TESTING W REFLEX): HIV Screen 4th Generation wRfx: NONREACTIVE

## 2019-04-30 LAB — RPR: RPR Ser Ql: NONREACTIVE

## 2019-04-30 LAB — HEPATITIS B SURFACE ANTIBODY, QUANTITATIVE: Hep B S AB Quant (Post): <3.1 m[IU]/mL — ABNORMAL LOW (ref >9.9)

## 2019-05-21 ENCOUNTER — Ambulatory Visit: Payer: BC Managed Care – PPO | Attending: Internal Medicine

## 2019-05-21 DIAGNOSIS — Z23 Encounter for immunization: Secondary | ICD-10-CM

## 2019-05-21 NOTE — Progress Notes (Signed)
   Covid-19 Vaccination Clinic  Name:  Alisha Hamilton    MRN: [id number] DOB: 23-Dec-1975  05/21/2019  [name] was observed post Covid-19 immunization for 15 minutes without incident. She was provided with Vaccine Information Sheet and instruction to access the V-Safe system.   [name] was instructed to call 911 with any severe reactions post vaccine: Marland Kitchen Difficulty breathing  . Swelling of face and throat  . A fast heartbeat  . A bad rash all over body  . Dizziness and weakness   Immunizations Administered    Name Date Dose VIS Date Route   Moderna COVID-19 Vaccine 05/21/2019 10:33 AM 0.5 mL 01/25/2019 Intramuscular   Manufacturer: Moderna   Lot: [id number]   [id number]

## 2019-06-01 ENCOUNTER — Encounter: Payer: Self-pay | Admitting: Physician Assistant

## 2019-06-01 ENCOUNTER — Other Ambulatory Visit: Payer: Self-pay

## 2019-06-01 ENCOUNTER — Ambulatory Visit: Payer: BC Managed Care – PPO | Admitting: Physician Assistant

## 2019-06-01 VITALS — BP 119/78 | HR 80 | Temp 97.1°F | Wt 151.0 lb

## 2019-06-01 DIAGNOSIS — F419 Anxiety disorder, unspecified: Secondary | ICD-10-CM

## 2019-06-01 MED ORDER — ALPRAZOLAM 0.5 MG PO TABS: 0.5000 mg | ORAL_TABLET | Freq: Every day | ORAL | 0 refills | Status: DC

## 2019-06-01 NOTE — Progress Notes (Signed)
Acute Office Visit  Subjective:    Patient ID: Alisha Hamilton, female    DOB: 19-Jul-1975, 44 y.o.   MRN: [id number]  Chief Complaint  Patient presents with  . Anxiety    Anxiety Presents for follow-up visit. Symptoms include nervous/anxious behavior. Patient reports no chest pain, confusion, decreased concentration, depressed mood, dizziness, excessive worry, insomnia, palpitations or suicidal ideas.     She is taking lexapro 20 mg QD. She is doing well on this. She is currently a Pharmacist, hospital. Last refilled Xanax 0.5 mg #30 in 2019. Uses these 1-2 times per month.   No past medical history on file.  Past Surgical History:  Procedure Laterality Date  . DILATION AND CURETTAGE OF UTERUS  2005  . LEEP  2011  . OTHER SURGICAL HISTORY     clips put on nerves 2006    Family History  Problem Relation Age of Onset  . Diabetes Father   . Anxiety disorder Sister   . Irritable bowel syndrome Sister   . Alcohol abuse Brother   . Hypertension Brother   . Hyperlipidemia Brother   . Anxiety disorder Brother   . Breast cancer Neg Hx     Social History   Socioeconomic History  . Marital status: Divorced    Spouse name: Not on file  . Number of children: Not on file  . Years of education: Not on file  . Highest education level: Not on file  Occupational History  . Not on file  Tobacco Use  . Smoking status: Never Smoker  . Smokeless tobacco: Never Used  Substance and Sexual Activity  . Alcohol use: Yes  . Drug use: No  . Sexual activity: Yes    Birth control/protection: I.U.D.  Other Topics Concern  . Not on file  Social History Narrative  . Not on file   Social Determinants of Health   Financial Resource Strain:   . Difficulty of Paying Living Expenses:   Food Insecurity:   . Worried About Charity fundraiser in the Last Year:   . Arboriculturist in the Last Year:   Transportation Needs:   . Film/video editor (Medical):   Marland Kitchen Lack of Transportation (Non-Medical):    Physical Activity:   . Days of Exercise per Week:   . Minutes of Exercise per Session:   Stress:   . Feeling of Stress :   Social Connections:   . Frequency of Communication with Friends and Family:   . Frequency of Social Gatherings with Friends and Family:   . Attends Religious Services:   . Active Member of Clubs or Organizations:   . Attends Archivist Meetings:   Marland Kitchen Marital Status:   Intimate Partner Violence:   . Fear of Current or Ex-Partner:   . Emotionally Abused:   Marland Kitchen Physically Abused:   . Sexually Abused:     Outpatient Medications Prior to Visit  Medication Sig Dispense Refill  . ALPRAZolam (XANAX) 0.5 MG tablet Take 1 tablet (0.5 mg total) by mouth daily. As needed for anxiety 30 tablet 0  . escitalopram (LEXAPRO) 20 MG tablet Take 1 tablet (20 mg total) by mouth daily. 90 tablet 1   No facility-administered medications prior to visit.    No Known Allergies  Review of Systems  Constitutional: Negative.   Respiratory: Negative.   Cardiovascular: Negative.  Negative for chest pain and palpitations.  Gastrointestinal: Negative.   Neurological: Negative for dizziness, light-headedness and headaches.  Psychiatric/Behavioral:  Negative for agitation, behavioral problems, confusion, decreased concentration, dysphoric mood, hallucinations, self-injury, sleep disturbance and suicidal ideas. The patient is nervous/anxious. The patient does not have insomnia and is not hyperactive.        Objective:    Physical Exam Constitutional:      Appearance: Normal appearance.  Cardiovascular:     Rate and Rhythm: Normal rate and regular rhythm.     Heart sounds: Normal heart sounds.  Pulmonary:     Effort: Pulmonary effort is normal.     Breath sounds: Normal breath sounds.  Skin:    General: Skin is warm and dry.  Neurological:     Mental Status: She is alert and oriented to person, place, and time. Mental status is at baseline.  Psychiatric:        Mood  and Affect: Mood normal.        Behavior: Behavior normal.     BP 119/78 (BP Location: Left Arm, Patient Position: Sitting, Cuff Size: Normal)   Pulse 80   Temp (!) 97.1 F (36.2 C) (Temporal)   Wt 151 lb (68.5 kg)   BMI 23.65 kg/m  Wt Readings from Last 3 Encounters:  06/01/19 151 lb (68.5 kg)  04/20/19 153 lb (69.4 kg)  12/04/17 151 lb 3.2 oz (68.6 kg)    There are no preventive care reminders to display for this patient.  There are no preventive care reminders to display for this patient.   No results found for: TSH Lab Results  Component Value Date   WBC 5.8 06/25/2009   HGB 13.3 06/25/2009   HCT 41 06/25/2009   PLT 193 06/25/2009   Lab Results  Component Value Date   NA 140 06/25/2009   K 4.0 06/25/2009   BUN 13 06/25/2009   CREATININE 0.6 06/25/2009   ALKPHOS 62 06/25/2009   AST 13 06/25/2009   ALT 13 06/25/2009   No results found for: CHOL No results found for: HDL No results found for: LDLCALC No results found for: TRIG No results found for: CHOLHDL No results found for: HGBA1C     Assessment & Plan:  1. Anxiety  Refill lexapro and refilled xanax.   The entirety of the information documented in the History of Present Illness, Review of Systems and Physical Exam were personally obtained by me. Portions of this information were initially documented by Ashley Royalty, CMA and reviewed by me for thoroughness and accuracy.   F/u 1 year    No orders of the defined types were placed in this encounter.    Kizzie Furnish, CMA

## 2019-06-01 NOTE — Patient Instructions (Signed)
Depression Screening Depression screening is a tool that your health care provider can use to learn if you have symptoms of depression. Depression is a common condition with many symptoms that are also often found in other conditions. Depression is treatable, but it must first be diagnosed. You may not know that certain feelings, thoughts, and behaviors that you are having can be symptoms of depression. Taking a depression screening test can help you and your health care provider decide if you need more assessment, or if you should be referred to a mental health care provider. What are the screening tests?  You may have a physical exam to see if another condition is affecting your mental health. You may have a blood or urine sample taken during the physical exam.  You may be interviewed using a screening tool that was developed from research, such as one of these: ? Patient Health Questionnaire (PHQ). This is a set of either 2 or 9 questions. A health care provider who has been trained to score this screening test uses a guide to assess if your symptoms suggest that you may have depression. ? Hamilton Depression Rating Scale (HAM-D). This is a set of either 17 or 24 questions. You may be asked to take it again during or after your treatment, to see if your depression has gotten better. ? Beck Depression Inventory (BDI). This is a set of 21 multiple choice questions. Your health care provider scores your answers to assess:  Your level of depression, ranging from mild to severe.  Your response to treatment.  Your health care provider may talk with you about your daily activities, such as eating, sleeping, work, and recreation, and ask if you have had any changes in activity.  Your health care provider may ask you to see a mental health specialist, such as a psychiatrist or psychologist, for more evaluation. Who should be screened for depression?   All adults, including adults with a family history  of a mental health disorder.  Adolescents who are 12-18 years old.  People who are recovering from a myocardial infarction (MI).  Pregnant women, or women who have given birth.  People who have a long-term (chronic) illness.  Anyone who has been diagnosed with another type of a mental health disorder.  Anyone who has symptoms that could show depression. What do my results mean? Your health care provider will review the results of your depression screening, physical exam, and lab tests. Positive screens suggest that you may have depression. Screening is the first step in getting the care that you may need. It is up to you to get your screening results. Ask your health care provider, or the department that is doing your screening tests, when your results will be ready. Talk with your health care provider about your results and diagnosis. A diagnosis of depression is made using the Diagnostic and Statistical Manual of Mental Disorders (DSM-V). This is a book that lists the number and type of symptoms that must be present for a health care provider to give a specific diagnosis.  Your health care provider may work with you to treat your symptoms of depression, or your health care provider may help you find a mental health provider who can assess, diagnose, and treat your depression. Get help right away if:  You have thoughts about hurting yourself or others. If you ever feel like you may hurt yourself or others, or have thoughts about taking your own life, get help right away. You   can go to your nearest emergency department or call:  Your local emergency services (911 in the [place]  A suicide crisis helpline, such as the National Suicide Prevention Lifeline at 1-800-273-8255. This is open 24 hours a day. Summary  Depression screening is the first step in getting the help that you may need.  If your screening test shows symptoms of depression (is positive), your health care provider may ask  you to see a mental health provider.  Anyone who is age 12 or older should be screened for depression. This information is not intended to replace advice given to you by your health care provider. Make sure you discuss any questions you have with your health care provider. Document Revised: 01/23/2017 Document Reviewed: 06/27/2016 Elsevier Patient Education  2020 Elsevier Inc.  

## 2019-09-16 ENCOUNTER — Other Ambulatory Visit: Payer: Self-pay | Admitting: Physician Assistant

## 2019-09-16 DIAGNOSIS — F419 Anxiety disorder, unspecified: Secondary | ICD-10-CM

## 2020-03-18 ENCOUNTER — Other Ambulatory Visit: Payer: Self-pay | Admitting: Physician Assistant

## 2020-03-18 DIAGNOSIS — F419 Anxiety disorder, unspecified: Secondary | ICD-10-CM

## 2020-03-18 NOTE — Telephone Encounter (Signed)
Requested Prescriptions  Pending Prescriptions Disp Refills  . escitalopram (LEXAPRO) 20 MG tablet [Pharmacy Med Name: ESCITALOPRAM 20MG  TABLETS] 30 tablet 0    Sig: TAKE 1 TABLET(20 MG) BY MOUTH DAILY     Psychiatry:  Antidepressants - SSRI Failed - 03/18/2020  3:14 PM      Failed - Valid encounter within last 6 months    Recent Outpatient Visits          9 months ago [place], [place] Bea[place], [place]   1 year ago Sore throat   [facility] Trinna Post, [place]   2 years ago [place], [place]   3 years ago [place], [place], [place]   4 years ago [place], [place]

## 2020-03-26 ENCOUNTER — Telehealth: Payer: Self-pay

## 2020-03-26 NOTE — Telephone Encounter (Signed)
Pt calling; tx'd from front desk whom she told she was gushing blood.  Adv pt if she is saturating a pad q78min-1hr she needs to go to the ED.  She doesn't have sanitary supplies and is using toilet paper and changing it out; hasn't felt strings nor has she seen the IUD come out.  Pt states her bleeding seems to be slowing.  Offered to tx for scheduling.  Pt opted to wait and see if it continues to slow down or not.  Reiterated policy.

## 2020-04-11 ENCOUNTER — Telehealth: Payer: Self-pay

## 2020-04-11 NOTE — Telephone Encounter (Signed)
LMVM TRC. Need to r/s 04/12/20 4:30 apt in [facility] d/t 03/26/20 phone call from patient states she was gushing blood and has not seen strings, uncertain if IUD has fallen out. She will need an u/s to check for IUD. If in place, she will not need a replacement as it was placed in 2019 and not due. However, 4:30 doesn't allow enough time for u/s and placement if needed. Earlier slots held for this patient or she can r/s to another day.

## 2020-04-12 ENCOUNTER — Ambulatory Visit: Payer: BC Managed Care – PPO | Admitting: Obstetrics and Gynecology

## 2020-04-12 ENCOUNTER — Ambulatory Visit (INDEPENDENT_AMBULATORY_CARE_PROVIDER_SITE_OTHER): Payer: Self-pay | Admitting: Obstetrics and Gynecology

## 2020-04-12 ENCOUNTER — Encounter: Payer: Self-pay | Admitting: Obstetrics and Gynecology

## 2020-04-12 VITALS — BP 126/79 | Ht 67.0 in | Wt 154.0 lb

## 2020-04-12 DIAGNOSIS — D259 Leiomyoma of uterus, unspecified: Secondary | ICD-10-CM

## 2020-04-12 DIAGNOSIS — T839XXA Unspecified complication of genitourinary prosthetic device, implant and graft, initial encounter: Secondary | ICD-10-CM

## 2020-04-12 NOTE — Progress Notes (Signed)
Obstetrics & Gynecology Office Visit   Chief Complaint  Patient presents with  . Contraception    IUD check   History of Present Illness: 45 y.o. G63P2012 female who presents for issues with her IUD. A couple weeks ago she had a gushing period. She thought she was peeing in her pants.  The next day she just had a heavy period.  She also has been spotting every other week, which has been going on for six months. She had the Mirena placed about 3 years ago.  She hasn't seen the IUD come out.  She has had no symptoms of pregnancy. She is sexually active. She reports being at low risk of STI.  Last pap smear 03/2019: normal, HPV negative.    Past Medical History:  Diagnosis Date  . No known health problems     Past Surgical History:  Procedure Laterality Date  . DILATION AND CURETTAGE OF UTERUS  2005  . LEEP  2011  . OTHER SURGICAL HISTORY     clips put on nerves 2006    Gynecologic History: No LMP recorded. (Menstrual status: IUD).  Obstetric History: [id number]  Family History  Problem Relation Age of Onset  . Diabetes Father   . Anxiety disorder Sister   . Irritable bowel syndrome Sister   . Alcohol abuse Brother   . Hypertension Brother   . Hyperlipidemia Brother   . Anxiety disorder Brother   . Breast cancer Neg Hx     Social History   Socioeconomic History  . Marital status: Divorced    Spouse name: Not on file  . Number of children: Not on file  . Years of education: Not on file  . Highest education level: Not on file  Occupational History  . Not on file  Tobacco Use  . Smoking status: Never Smoker  . Smokeless tobacco: Never Used  Vaping Use  . Vaping Use: Never used  Substance and Sexual Activity  . Alcohol use: Yes  . Drug use: No  . Sexual activity: Yes    Birth control/protection: I.U.D.  Other Topics Concern  . Not on file  Social History Narrative  . Not on file   Social Determinants of Health   Financial Resource Strain: Not on file  Food  Insecurity: Not on file  Transportation Needs: Not on file  Physical Activity: Not on file  Stress: Not on file  Social Connections: Not on file  Intimate Partner Violence: Not on file   Allergies: No Known Allergies  Prior to Admission medications   Medication Sig Start Date End Date Taking? Authorizing Provider  ALPRAZolam Duanne Moron) 0.5 MG tablet Take 1 tablet (0.5 mg total) by mouth daily. As needed for anxiety 06/01/19  Yes Trinna Post, PA-C  escitalopram (LEXAPRO) 20 MG tablet TAKE 1 TABLET(20 MG) BY MOUTH DAILY 03/18/20  Yes Trinna Post, PA-C    Review of Systems  Constitutional: Negative.   HENT: Negative.   Eyes: Negative.   Respiratory: Negative.   Cardiovascular: Negative.   Gastrointestinal: Negative.   Genitourinary: Negative.   Musculoskeletal: Negative.   Skin: Negative.   Neurological: Negative.   Psychiatric/Behavioral: Negative.      Physical Exam BP 126/79   Ht 5\' 7"  (1.702 m)   Wt 154 lb (69.9 kg)   BMI 24.12 kg/m  No LMP recorded. (Menstrual status: IUD). Physical Exam Constitutional:      General: She is not in acute distress.    Appearance: Normal appearance. She is  well-developed.  Genitourinary:     Vulva, bladder and urethral meatus normal.     No lesions in the vagina.     Right Labia: No rash, tenderness, lesions, skin changes or Bartholin's cyst.    Left Labia: No tenderness, skin changes, Bartholin's cyst or rash.    No inguinal adenopathy present in the right or left side.    Pelvic Tanner Score: 5/5.    No vaginal discharge, tenderness or bleeding.     No vaginal prolapse present.    No vaginal atrophy present.     Right Adnexa: not tender, not full and no mass present.    Left Adnexa: not tender, not full and no mass present.    No cervical motion tenderness, friability, lesion or polyp.     IUD strings visualized (approximately 3cm in length).     Uterus is enlarged.     Uterus is not fixed or tender.     Uterus is  anteverted.     No urethral tenderness or mass present.     Pelvic exam was performed with patient in the lithotomy position.  HENT:     Head: Normocephalic and atraumatic.  Eyes:     General: No scleral icterus.    Conjunctiva/sclera: Conjunctivae normal.  Cardiovascular:     Rate and Rhythm: Normal rate and regular rhythm.     Heart sounds: No murmur heard. No friction rub. No gallop.   Pulmonary:     Effort: Pulmonary effort is normal. No respiratory distress.     Breath sounds: Normal breath sounds. No wheezing or rales.  Abdominal:     General: Bowel sounds are normal. There is no distension.     Palpations: Abdomen is soft. There is no mass.     Tenderness: There is no abdominal tenderness. There is no guarding or rebound.     Hernia: There is no hernia in the left inguinal area or right inguinal area.  Musculoskeletal:        General: Normal range of motion.     Cervical back: Normal range of motion and neck supple.  Lymphadenopathy:     Lower Body: No right inguinal adenopathy. No left inguinal adenopathy.  Neurological:     General: No focal deficit present.     Mental Status: She is alert and oriented to person, place, and time.     Cranial Nerves: No cranial nerve deficit.  Skin:    General: Skin is warm and dry.     Findings: No erythema.  Psychiatric:        Mood and Affect: Mood normal.        Behavior: Behavior normal.        Judgment: Judgment normal.    Bedside transabdominal ultrasound: Uterus appears to have an approximately 4.8 x 4.8 x 5.8 cm anterior fibroid.  Based on this sonographic approach, it is difficult to exactly discern the borders of the fibroid. So, the measurements could be off somewhat.  The location (intramural, etc.) is also difficult to discern. There is an IUD within her uterus. However, given the distortion of the fibroid, the location of the IUD within the uterus is difficult to tell for sure.  No adnexal evaluation was performed.    Female chaperone present for pelvic and breast  portions of the physical exam  Assessment: 45 y.o. [id number] female here for  1. Complication of intrauterine device (IUD), unspecified complication, initial encounter ([place])   2. Uterine leiomyoma, unspecified location  Plan: Problem List Items Addressed This Visit   None   Visit Diagnoses    Complication of intrauterine device (IUD), unspecified complication, initial encounter ([place])    -  Primary   Relevant Orders   US PELVIS TRANSVAGINAL NON-OB (TV ONLY)   Uterine leiomyoma, unspecified location       Relevant Orders   US PELVIS TRANSVAGINAL NON-OB (TV ONLY)     Discussed initial findings of her having a fibroid in her uterus. There appears to be just one. However, a transvaginal approach will give more detail to the fibroid location and, perhaps, the IUD location.  We discussed that while her more significant symptoms have only been present for a short while, the fibroid has likely been growing and present for some time.  We discussed that it is difficult to say whether the fibroid is growing quickly or slowly.  Will start with a transvaginal ultrasound. Once we better characterize the location of the fibroid and IUD, we can formulate a better plan to resolve/improve her bleeding pattern.  A total of 23 minutes were spent face-to-face with the patient as well as preparation, review, communication, and documentation during this encounter.    Prentice Docker, MD 04/12/2020 5:04 PM

## 2020-04-21 ENCOUNTER — Other Ambulatory Visit: Payer: Self-pay | Admitting: Physician Assistant

## 2020-04-21 DIAGNOSIS — F419 Anxiety disorder, unspecified: Secondary | ICD-10-CM

## 2020-04-21 NOTE — Telephone Encounter (Signed)
Pt hs upcoming appt this Thursday - will refill with enough med to last until appt.

## 2020-04-21 NOTE — Telephone Encounter (Signed)
Sent pt reminder to make appt for med refill (Lexapro). Pt's last OV was 10 months ago. Reminder sent via MyChart message. Routing to office to advise if able to given short term refill.

## 2020-04-26 ENCOUNTER — Other Ambulatory Visit: Payer: Self-pay | Admitting: Obstetrics and Gynecology

## 2020-04-26 ENCOUNTER — Encounter: Payer: Self-pay | Admitting: Physician Assistant

## 2020-04-26 ENCOUNTER — Ambulatory Visit: Payer: BC Managed Care – PPO | Admitting: Physician Assistant

## 2020-04-26 ENCOUNTER — Other Ambulatory Visit: Payer: Self-pay

## 2020-04-26 DIAGNOSIS — F419 Anxiety disorder, unspecified: Secondary | ICD-10-CM | POA: Diagnosis not present

## 2020-04-26 DIAGNOSIS — T839XXA Unspecified complication of genitourinary prosthetic device, implant and graft, initial encounter: Secondary | ICD-10-CM

## 2020-04-26 DIAGNOSIS — D259 Leiomyoma of uterus, unspecified: Secondary | ICD-10-CM

## 2020-04-26 MED ORDER — ESCITALOPRAM OXALATE 20 MG PO TABS: ORAL_TABLET | ORAL | 3 refills | Status: DC

## 2020-04-26 MED ORDER — ALPRAZOLAM 0.5 MG PO TABS: 0.5000 mg | ORAL_TABLET | Freq: Every day | ORAL | 0 refills | Status: DC

## 2020-04-26 NOTE — Progress Notes (Signed)
Established patient visit   Patient: Alisha Hamilton   DOB: 08-26-1975   45 y.o. Female  MRN: [id number] Visit Date: 04/26/2020  Today's healthcare provider: Trinna Post, Alisha Hamilton   Chief Complaint  Patient presents with  . Anxiety  I,Alisha Hamilton M Alisha Hamilton,acting as a scribe for Alisha Post, Alisha Hamilton.,have documented all relevant documentation on the behalf of Alisha Post, Alisha Hamilton,as directed by  Alisha Post, Alisha Hamilton while in the presence of Alisha Post, Alisha Hamilton.  Subjective    HPI  Anxiety, Follow-up  She was last seen for anxiety 11 months ago. Changes made at last visit include continue current medication.   She reports good compliance with treatment. She reports good tolerance of treatment. She is not having side effects.   She feels her anxiety is mild and Improved since last visit.  Symptoms: No chest pain No difficulty concentrating  No dizziness No fatigue  No feelings of losing control No insomnia  No irritable No palpitations  No panic attacks No racing thoughts  No shortness of breath No sweating  No tremors/shakes    GAD-7 Results GAD-7 Generalized Anxiety Disorder Screening Tool 04/26/2020 06/01/2019  1. Feeling Nervous, Anxious, or on Edge 0 0  2. Not Being Able to Stop or Control Worrying 0 0  3. Worrying Too Much About Different Things 0 0  4. Trouble Relaxing 0 0  5. Being So Restless it's Hard To Sit Still 0 0  6. Becoming Easily Annoyed or Irritable 0 0  7. Feeling Afraid As If Something Awful Might Happen 0 0  Total GAD-7 Score 0 0  Difficulty At Work, Home, or Getting  Along With Others? Not difficult at all Not difficult at all    PHQ-9 Scores PHQ9 SCORE ONLY 06/01/2019  PHQ-9 Total Score 1   Wt Readings from Last 3 Encounters:  04/26/20 156 lb 6.4 oz (70.9 kg)  04/12/20 154 lb (69.9 kg)  06/01/19 151 lb (68.5 kg)    ---------------------------------------------------------------------------------------------------       Medications: Outpatient Medications Prior to Visit  Medication Sig  . [DISCONTINUED] ALPRAZolam (XANAX) 0.5 MG tablet Take 1 tablet (0.5 mg total) by mouth daily. As needed for anxiety  . [DISCONTINUED] escitalopram (LEXAPRO) 20 MG tablet TAKE 1 TABLET(20 MG) BY MOUTH DAILY   No facility-administered medications prior to visit.    Review of Systems  Constitutional: Negative.   Respiratory: Negative.   Hematological: Negative.   Psychiatric/Behavioral: Negative for agitation, decreased concentration, self-injury, sleep disturbance and suicidal ideas. The patient is not nervous/anxious.        Objective    BP 126/85 (BP Location: Left Arm, Patient Position: Sitting, Cuff Size: Normal)   Pulse 88   Temp 98.7 F (37.1 C) (Oral)   Wt 156 lb 6.4 oz (70.9 kg)   SpO2 100%   BMI 24.50 kg/m     Physical Exam Constitutional:      Appearance: Normal appearance.  Cardiovascular:     Rate and Rhythm: Normal rate and regular rhythm.     Heart sounds: Normal heart sounds.  Pulmonary:     Effort: Pulmonary effort is normal.     Breath sounds: Normal breath sounds.  Skin:    General: Skin is warm and dry.  Neurological:     Mental Status: She is alert and oriented to person, place, and time. Mental status is at baseline.  Psychiatric:        Mood and Affect: Mood normal.  Behavior: Behavior normal.       No results found for any visits on 04/26/20.  Assessment & Plan    1. Anxiety  Continue for one year. Advised continued sparing use of Xanax.   - escitalopram (LEXAPRO) 20 MG tablet; TAKE 1 TABLET(20 MG) BY MOUTH DAILY  Dispense: 90 tablet; Refill: 3   Return in about 1 year (around 04/26/2021) for anxiety.      ITrinna Post, Alisha Hamilton, have reviewed all documentation for this visit. The documentation on 04/30/20 for the exam, diagnosis, procedures, and orders are all accurate and complete.  The entirety of the information documented in the History of Present  Illness, Review of Systems and Physical Exam were personally obtained by me. Portions of this information were initially documented by Alisha Hamilton and reviewed by me for thoroughness and accuracy.     Alisha Hamilton  [facility] [phone] (phone) [phone] (fax)  [place]

## 2020-04-30 ENCOUNTER — Ambulatory Visit: Payer: BC Managed Care – PPO | Admitting: Obstetrics and Gynecology

## 2020-04-30 ENCOUNTER — Ambulatory Visit: Payer: BC Managed Care – PPO

## 2020-04-30 DIAGNOSIS — D259 Leiomyoma of uterus, unspecified: Secondary | ICD-10-CM

## 2020-04-30 DIAGNOSIS — T839XXA Unspecified complication of genitourinary prosthetic device, implant and graft, initial encounter: Secondary | ICD-10-CM

## 2020-05-03 ENCOUNTER — Other Ambulatory Visit: Payer: Self-pay

## 2020-05-03 ENCOUNTER — Ambulatory Visit
Admission: RE | Admit: 2020-05-03 | Discharge: 2020-05-03 | Disposition: A | Discharge status: Home / Self Care | Payer: BC Managed Care – PPO | Source: Ambulatory Visit | Attending: Obstetrics and Gynecology | Admitting: Obstetrics and Gynecology

## 2020-05-03 DIAGNOSIS — T839XXA Unspecified complication of genitourinary prosthetic device, implant and graft, initial encounter: Secondary | ICD-10-CM | POA: Insufficient documentation

## 2020-05-03 DIAGNOSIS — D259 Leiomyoma of uterus, unspecified: Secondary | ICD-10-CM | POA: Diagnosis present

## 2020-05-04 ENCOUNTER — Ambulatory Visit (INDEPENDENT_AMBULATORY_CARE_PROVIDER_SITE_OTHER): Payer: BC Managed Care – PPO | Admitting: Obstetrics and Gynecology

## 2020-05-04 ENCOUNTER — Encounter: Payer: Self-pay | Admitting: Obstetrics and Gynecology

## 2020-05-04 VITALS — BP 120/80 | Ht 66.0 in | Wt 152.0 lb

## 2020-05-04 DIAGNOSIS — D259 Leiomyoma of uterus, unspecified: Secondary | ICD-10-CM | POA: Diagnosis not present

## 2020-05-04 DIAGNOSIS — N921 Excessive and frequent menstruation with irregular cycle: Secondary | ICD-10-CM | POA: Diagnosis not present

## 2020-05-04 DIAGNOSIS — N946 Dysmenorrhea, unspecified: Secondary | ICD-10-CM | POA: Diagnosis not present

## 2020-05-04 NOTE — Progress Notes (Signed)
Fibroid: 7.33 x 6 x 7 cm ROV cyst 2.3 x 1.8 x 2.6 cm LOV cyst 2.1 x 1.8 x 2.2 cm  Mild free fluid    Gynecology Ultrasound Follow Up  Chief Complaint:  Chief Complaint  Patient presents with  . Follow-up    U/S  likely anterior fibroid. Menorrhagia with irregular cycles, and dysmenorrhea   History of Present Illness: Patient is a 45 y.o. female who presents today for ultrasound evaluation of the above issues.  Alisha Hamilton  Ultrasound demonstrates the following findings (these are based on my initial read of the ultrasound, which was performed at the hospital and has not been read by a radiologist as of the time of this appointment.    Adnexa: Right ovarian cyst measuring 2.3 x 1.8 x 2. 6 cm, simple appearing.  Left ovarian cyst, measuring 2.1 x 1.8 x 2.2 cm, simple appearing.    Uterus: anteverted with endometrial stripe undetermined by me.  Additional: anterior fibroid (location in relation to the myometrium is difficult to tell, measures 7.33 x 6 x 7 cm. Mild free fluid in the cul-de-sac IUD present, appears to be in correct location.  Possible somewhat lower than the fundus, though I can not tell for sure based on my read of the images and shadowing effect of the fibroid.   She has not had continued bleeding. She continues to have discomfort.    Past Medical History:  Diagnosis Date  . No known health problems     Past Surgical History:  Procedure Laterality Date  . DILATION AND CURETTAGE OF UTERUS  2005  . LEEP  2011  . OTHER SURGICAL HISTORY     clips put on nerves 2006    Family History  Problem Relation Age of Onset  . Diabetes Father   . Anxiety disorder Sister   . Irritable bowel syndrome Sister   . Alcohol abuse Brother   . Hypertension Brother   . Hyperlipidemia Brother   . Anxiety disorder Brother   . Breast cancer Neg Hx     Social History   Socioeconomic History  . Marital status: Divorced    Spouse name: Not on file  . Number of children: Not on file  .  Years of education: Not on file  . Highest education level: Not on file  Occupational History  . Not on file  Tobacco Use  . Smoking status: Never Smoker  . Smokeless tobacco: Never Used  Vaping Use  . Vaping Use: Never used  Substance and Sexual Activity  . Alcohol use: Yes  . Drug use: No  . Sexual activity: Yes    Birth control/protection: I.U.D.    Comment: Mirena  Other Topics Concern  . Not on file  Social History Narrative  . Not on file   Social Determinants of Health   Financial Resource Strain: Not on file  Food Insecurity: Not on file  Transportation Needs: Not on file  Physical Activity: Not on file  Stress: Not on file  Social Connections: Not on file  Intimate Partner Violence: Not on file    No Known Allergies  Prior to Admission medications   Medication Sig Start Date End Date Taking? Authorizing Provider  ALPRAZolam Duanne Moron) 0.5 MG tablet Take 1 tablet (0.5 mg total) by mouth daily. As needed for anxiety 04/26/20  Yes Trinna Post, PA-C  escitalopram (LEXAPRO) 20 MG tablet TAKE 1 TABLET(20 MG) BY MOUTH DAILY 04/26/20  Yes Trinna Post, PA-C  levonorgestrel (MIRENA) 20  MCG/24HR IUD 1 each by Intrauterine route once. 06/25/17  Yes [provider]    Physical Exam BP 120/80   Ht 5\' 6"  (1.676 m)   Wt 152 lb (68.9 kg)   BMI 24.53 kg/m    General: NAD HEENT: normocephalic, anicteric Pulmonary: No increased work of breathing Extremities: no edema, erythema, or tenderness Neurologic: Grossly intact, normal gait Psychiatric: mood appropriate, affect full  Imaging Results See above, awaiting final read.  Assessment: 45 y.o. [id number]  1. Uterine leiomyoma, unspecified location   2. Dysmenorrhea   3. Menorrhagia with irregular cycle      Plan: Problem List Items Addressed This Visit   None   Visit Diagnoses    Uterine leiomyoma, unspecified location    -  Primary   Dysmenorrhea       Menorrhagia with irregular cycle          Discussed awaiting final read. Options for treatment, if read is similar include; doing nothing at this time with clinical observation only, treatment with uterine fibroid embolization, treatment with either open or laparoscopic myomectomy, laparoscopic hysterectomy with retention of the ovaries.    We discussed the pros and cons of each option. She will consider her options and we will likely re-image this summer, though she is leaning toward hysterectomy.    A total of 30 minutes were spent face-to-face with the patient as well as preparation, review, communication, and documentation during this encounter.    Prentice Docker, MD, [facility], [facility] 05/04/2020 5:06 PM

## 2020-05-05 ENCOUNTER — Encounter: Payer: Self-pay | Admitting: Obstetrics and Gynecology

## 2020-05-10 ENCOUNTER — Other Ambulatory Visit: Payer: Self-pay | Admitting: Obstetrics and Gynecology

## 2020-05-10 DIAGNOSIS — D259 Leiomyoma of uterus, unspecified: Secondary | ICD-10-CM

## 2020-05-10 DIAGNOSIS — T839XXA Unspecified complication of genitourinary prosthetic device, implant and graft, initial encounter: Secondary | ICD-10-CM

## 2020-08-06 ENCOUNTER — Ambulatory Visit: Payer: BC Managed Care – PPO | Admitting: Obstetrics and Gynecology

## 2020-11-05 ENCOUNTER — Other Ambulatory Visit: Payer: Self-pay

## 2020-11-05 ENCOUNTER — Ambulatory Visit: Payer: BC Managed Care – PPO | Admitting: Obstetrics and Gynecology

## 2020-11-05 VITALS — BP 126/84 | Ht 67.0 in

## 2020-11-05 DIAGNOSIS — D259 Leiomyoma of uterus, unspecified: Secondary | ICD-10-CM | POA: Diagnosis not present

## 2020-11-05 DIAGNOSIS — N946 Dysmenorrhea, unspecified: Secondary | ICD-10-CM

## 2020-11-05 DIAGNOSIS — N921 Excessive and frequent menstruation with irregular cycle: Secondary | ICD-10-CM

## 2020-11-05 NOTE — Progress Notes (Signed)
Obstetrics & Gynecology Office Visit   Chief Complaint  Patient presents with   Follow-up   Consult    History of Present Illness: [name] who presents in follow up from her ultrasound in March. She has worse symptoms. She feels more pain, she voids frequently.  She did not have bleeding during the summer. She started bleeding again in August and she hasn't stopped.  She had an IUD placed 14 years ago for birth control.   The bleeding is heavy. She is passing clots.  She will fill up a tampon in the time it takes her to put on her makeup.  She would like to discuss options of treatment.     Past Medical History:  Diagnosis Date   No known health problems     Past Surgical History:  Procedure Laterality Date   DILATION AND CURETTAGE OF UTERUS  2005   LEEP  2011   OTHER SURGICAL HISTORY     clips put on nerves 2006    Gynecologic History: No LMP recorded. (Menstrual status: IUD).  Obstetric History: [id number]  Family History  Problem Relation Age of Onset   Diabetes Father    Anxiety disorder Sister    Irritable bowel syndrome Sister    Alcohol abuse Brother    Hypertension Brother    Hyperlipidemia Brother    Anxiety disorder Brother    Breast cancer Neg Hx     Social History   Socioeconomic History   Marital status: Divorced    Spouse name: Not on file   Number of children: Not on file   Years of education: Not on file   Highest education level: Not on file  Occupational History   Not on file  Tobacco Use   Smoking status: Never   Smokeless tobacco: Never  Vaping Use   Vaping Use: Never used  Substance and Sexual Activity   Alcohol use: Yes   Drug use: No   Sexual activity: Yes    Birth control/protection: I.U.D.    Comment: Mirena  Other Topics Concern   Not on file  Social History Narrative   Not on file   Social Determinants of Health   Financial Resource Strain: Not on file  Food Insecurity: Not on file  Transportation Needs:  Not on file  Physical Activity: Not on file  Stress: Not on file  Social Connections: Not on file  Intimate Partner Violence: Not on file    No Known Allergies  Prior to Admission medications   Medication Sig Start Date End Date Taking? Authorizing Provider  ALPRAZolam Alisha Hamilton) 0.5 MG tablet Take 1 tablet (0.5 mg total) by mouth daily. As needed for anxiety 04/26/20   Trinna Post, PA-C  escitalopram (LEXAPRO) 20 MG tablet TAKE 1 TABLET(20 MG) BY MOUTH DAILY 04/26/20   Trinna Post, PA-C  levonorgestrel (MIRENA) 20 MCG/24HR IUD 1 each by Intrauterine route once. 06/25/17   [provider]    Review of Systems  Constitutional: Negative.   HENT: Negative.    Eyes: Negative.   Respiratory: Negative.    Cardiovascular: Negative.   Gastrointestinal: Negative.   Genitourinary: Negative.        See HPI  Musculoskeletal: Negative.   Skin: Negative.   Neurological: Negative.   Psychiatric/Behavioral: Negative.      Physical Exam BP 126/84   Ht '5\' 7"'$  (1.702 m)   BMI 23.81 kg/m  No LMP recorded. (Menstrual status: IUD). Physical Exam Constitutional:  General: She is not in acute distress.    Appearance: Normal appearance.  HENT:     Head: Normocephalic and atraumatic.  Eyes:     General: No scleral icterus.    Conjunctiva/sclera: Conjunctivae normal.  Neurological:     General: No focal deficit present.     Mental Status: She is alert and oriented to person, place, and time.     Cranial Nerves: No cranial nerve deficit.  Psychiatric:        Mood and Affect: Mood normal.        Behavior: Behavior normal.        Judgment: Judgment normal.    Female chaperone present for pelvic and breast  portions of the physical exam  Assessment: 45 y.o. [id number] female here for  1. Menorrhagia with irregular cycle   2. Dysmenorrhea   3. Uterine leiomyoma, unspecified location      Plan: Problem List Items Addressed This Visit   None Visit Diagnoses      Menorrhagia with irregular cycle    -  Primary   Dysmenorrhea       Uterine leiomyoma, unspecified location          Discussed management options for abnormal uterine bleeding including expectant, NSAIDs, tranexamic acid (Lysteda), oral progesterone (Provera, norethindrone, megace), Depo Provera, Levonorgestrel containing IUD, endometrial ablation (Novasure) or hysterectomy as definitive surgical management.  We also discussed uterine fibroid embolization.  Discussed risks and benefits of each method.   Based on a lengthy discussion, she would like to explore uterine fibroid embolization, though she is still interested in definitive management with a hysterectomy. She asked me to arrange a consultation for embolization while simultaneously scheduling a hysterectomy.  She will make a final decision and let me know.  A total of 23 minutes were spent face-to-face with the patient as well as preparation, review, communication, and documentation during this encounter.    Prentice Docker, MD 11/06/2020 7:50 AM

## 2020-11-06 ENCOUNTER — Encounter: Payer: Self-pay | Admitting: Obstetrics and Gynecology

## 2020-11-16 ENCOUNTER — Telehealth: Payer: Self-pay

## 2020-11-16 NOTE — Telephone Encounter (Signed)
Left a message for the patient to return the call.  

## 2020-11-22 NOTE — Telephone Encounter (Signed)
-----   Message from Will Bonnet, MD sent at 11/06/2020  7:51 AM EDT ----- Regarding: Schedule surgery Surgery Booking Request Patient Full Name:  Alisha Hamilton  MRN: [id number]  DOB: 24-Jul-1975  Surgeon: Prentice Docker, MD  Requested Surgery Date and Time: TBD Primary Diagnosis AND Code:  1) Menorrhagia wit irregular cycle [N92.1] 2) Uterine leiomyoma, unspecified location [D25.9] 3) Dysmenorrhea [ N94.6] Secondary Diagnosis and Code:  Surgical Procedure: Robot assisted TLH/BS, cysto RNFA Requested?: Yes L&D Notification: No Admission Status: same day surgery Length of Surgery: 150 min Special Case Needs: Yes, Xi DaVinci robot H&P: Yes Phone Interview???:  Yes Interpreter: No Medical Clearance:  No Special Scheduling Instructions: No Any known health/anesthesia issues, diabetes, sleep apnea, latex allergy, defibrillator/pacemaker?: No Acuity: P3   (P1 highest, P2 delay may cause harm, P3 low, elective gyn, P4 lowest) Post op follow up visits: 1 week postop for incision check

## 2020-11-22 NOTE — Telephone Encounter (Signed)
Called patient to schedule Robot assisted TLH/BS, cystoscopy w Kendra Opitz 11/17  H&P 11/11 @ 9:50   Pre-admit phone call appointment to be requested - date and time will be included on H&P paper work. Also all appointments will be updated on pt MyChart. Explained that this appointment has a call window. Based on the time scheduled will indicate if the call will be received within a 4 hour window before 1:00 or after.  Advised that pt may also receive calls from the hospital pharmacy and pre-service center.  Confirmed pt has [employer] as Chartered certified accountant. No secondary insurance.

## 2021-01-01 ENCOUNTER — Other Ambulatory Visit
Admission: RE | Admit: 2021-01-01 | Discharge: 2021-01-01 | Disposition: A | Discharge status: Home / Self Care | Payer: BC Managed Care – PPO | Source: Ambulatory Visit | Attending: Obstetrics and Gynecology | Admitting: Obstetrics and Gynecology

## 2021-01-01 VITALS — Ht 67.0 in | Wt 153.0 lb

## 2021-01-01 DIAGNOSIS — Z87410 Personal history of cervical dysplasia: Secondary | ICD-10-CM

## 2021-01-01 DIAGNOSIS — Z01812 Encounter for preprocedural laboratory examination: Secondary | ICD-10-CM

## 2021-01-01 HISTORY — DX: Anxiety disorder, unspecified: F41.9

## 2021-01-01 HISTORY — DX: Other complications of anesthesia, initial encounter: T88.59XA

## 2021-01-01 NOTE — Patient Instructions (Addendum)
Your procedure is scheduled on: Thursday, November 17 Report to the Registration Desk on the 1st floor of the [facility]. To find out your arrival time, please call [phone] between 1PM - 3PM on: Wednesday, November 16  REMEMBER: Instructions that are not followed completely may result in serious medical risk, up to and including death; or upon the discretion of your surgeon and anesthesiologist your surgery may need to be rescheduled.  Do not eat food after midnight the night before surgery.  No gum chewing, lozengers or hard candies.  You may however, drink CLEAR liquids up to 2 hours before you are scheduled to arrive for your surgery. Do not drink anything within 2 hours of your scheduled arrival time.  Clear liquids include: - water  - apple juice without pulp - gatorade (not RED, PURPLE, OR BLUE) - black coffee or tea (Do NOT add milk or creamers to the coffee or tea) Do NOT drink anything that is not on this list.  In addition, your doctor has ordered for you to drink the provided  Ensure Pre-Surgery Clear Carbohydrate Drink  Drinking this carbohydrate drink up to two hours before surgery helps to reduce insulin resistance and improve patient outcomes. Please complete drinking 2 hours prior to scheduled arrival time.  TAKE THESE MEDICATIONS THE MORNING OF SURGERY WITH A SIP OF WATER:  Escitalopram (Lexapro)  One week prior to surgery: starting November 10 Stop Anti-inflammatories (NSAIDS) such as Advil, Aleve, Ibuprofen, Motrin, Naproxen, Naprosyn and Aspirin based products such as Excedrin, Goodys Powder, BC Powder. Stop ANY OVER THE COUNTER supplements until after surgery. You may however, continue to take Tylenol if needed for pain up until the day of surgery.  No Alcohol for 24 hours before or after surgery.  No Smoking including e-cigarettes for 24 hours prior to surgery.  No chewable tobacco products for at least 6 hours prior to surgery.  No nicotine patches  on the day of surgery.  Do not use any "recreational" drugs for at least a week prior to your surgery.  Please be advised that the combination of cocaine and anesthesia may have negative outcomes, up to and including death. If you test positive for cocaine, your surgery will be cancelled.  On the morning of surgery brush your teeth with toothpaste and water, you may rinse your mouth with mouthwash if you wish. Do not swallow any toothpaste or mouthwash.  Use CHG Soap as directed on instruction sheet.  Do not wear jewelry, make-up, hairpins, clips or nail polish.  Do not wear lotions, powders, or perfumes.   Do not shave body from the neck down 48 hours prior to surgery just in case you cut yourself which could leave a site for infection.  Also, freshly shaved skin may become irritated if using the CHG soap.  Contact lenses, hearing aids and dentures may not be worn into surgery.  Do not bring valuables to the hospital. [facility] is not responsible for any missing/lost belongings or valuables.   Notify your doctor if there is any change in your medical condition (cold, fever, infection).  Wear comfortable clothing (specific to your surgery type) to the hospital.  After surgery, you can help prevent lung complications by doing breathing exercises.  Take deep breaths and cough every 1-2 hours. Your doctor may order a device called an Incentive Spirometer to help you take deep breaths. When coughing or sneezing, hold a pillow firmly against your incision with both hands. This is called "splinting." Doing  this helps protect your incision. It also decreases belly discomfort.  If you are being discharged the day of surgery, you will not be allowed to drive home. You will need a responsible adult (18 years or older) to drive you home and stay with you that night.   If you are taking public transportation, you will need to have a responsible adult (18 years or older) with you. Please  confirm with your physician that it is acceptable to use public transportation.   Please call the [facility] at [phone] if you have any questions about these instructions.  Surgery Visitation Policy:  Patients undergoing a surgery or procedure may have one family member or support person with them as long as that person is not COVID-19 positive or experiencing its symptoms.  That person may remain in the waiting area during the procedure and may rotate out with other people.

## 2021-01-02 ENCOUNTER — Other Ambulatory Visit: Payer: Self-pay

## 2021-01-02 ENCOUNTER — Encounter: Payer: Self-pay | Admitting: Obstetrics and Gynecology

## 2021-01-04 ENCOUNTER — Encounter: Payer: Self-pay | Admitting: Urgent Care

## 2021-01-04 ENCOUNTER — Ambulatory Visit (INDEPENDENT_AMBULATORY_CARE_PROVIDER_SITE_OTHER): Payer: BC Managed Care – PPO | Admitting: Obstetrics and Gynecology

## 2021-01-04 ENCOUNTER — Encounter: Payer: Self-pay | Admitting: Obstetrics and Gynecology

## 2021-01-04 ENCOUNTER — Other Ambulatory Visit: Payer: Self-pay

## 2021-01-04 ENCOUNTER — Encounter
Admission: RE | Admit: 2021-01-04 | Discharge: 2021-01-04 | Disposition: A | Discharge status: Home / Self Care | Payer: BC Managed Care – PPO | Source: Ambulatory Visit | Attending: Obstetrics and Gynecology | Admitting: Obstetrics and Gynecology

## 2021-01-04 VITALS — BP 110/70 | Ht 67.0 in | Wt 156.0 lb

## 2021-01-04 DIAGNOSIS — Z01812 Encounter for preprocedural laboratory examination: Secondary | ICD-10-CM | POA: Diagnosis not present

## 2021-01-04 DIAGNOSIS — Z87410 Personal history of cervical dysplasia: Secondary | ICD-10-CM | POA: Insufficient documentation

## 2021-01-04 DIAGNOSIS — N921 Excessive and frequent menstruation with irregular cycle: Secondary | ICD-10-CM

## 2021-01-04 DIAGNOSIS — N946 Dysmenorrhea, unspecified: Secondary | ICD-10-CM

## 2021-01-04 DIAGNOSIS — D259 Leiomyoma of uterus, unspecified: Secondary | ICD-10-CM

## 2021-01-04 LAB — BASIC METABOLIC PANEL
Anion gap: 4 — ABNORMAL LOW (ref 5–15)
BUN: 12 mg/dL (ref 6–20)
CO2: 27 mmol/L (ref 22–32)
Calcium: 8.8 mg/dL — ABNORMAL LOW (ref 8.9–10.3)
Chloride: 105 mmol/L (ref 98–111)
Creatinine, Ser: 0.48 mg/dL (ref 0.44–1.00)
GFR, Estimated: >60 mL/min (ref >60)
Glucose, Bld: 90 mg/dL (ref 70–99)
Potassium: 4 mmol/L (ref 3.5–5.1)
Sodium: 136 mmol/L (ref 135–145)

## 2021-01-04 LAB — CBC
HCT: 41.9 % (ref 36.0–46.0)
Hemoglobin: 13.7 g/dL (ref 12.0–15.0)
MCH: 29.3 pg (ref 26.0–34.0)
MCHC: 32.7 g/dL (ref 30.0–36.0)
MCV: 89.5 fL (ref 80.0–100.0)
Platelets: 218 10*3/uL (ref 150–400)
RBC: 4.68 MIL/uL (ref 3.87–5.11)
RDW: 11.9 % (ref 11.5–15.5)
WBC: 6.5 10*3/uL (ref 4.0–10.5)
nRBC: 0 % (ref 0.0–0.2)

## 2021-01-04 LAB — TYPE AND SCREEN
ABO/RH(D): O POS
Antibody Screen: NEGATIVE

## 2021-01-04 NOTE — H&P (View-Only) (Signed)
Preoperative History and Physical  Alisha Hamilton is a 45 y.o. [id number] here for surgical management of menorrhagia with irregular cycle, dysmenorrhea, uterine fibroid.   No significant preoperative concerns.  History of Present Illness: 45 y.o. G49P2012 female who presents for pre-operative visit for surgery. She has long-standing heavy menstrual bleeding associated with pain. She voids frequently.  She has had intermitted times with no menses and then bleeding episodes that would last a long time. She had an IUD placed 14 years ago for birth control (Most recently changed 3 years ago).   The bleeding is heavy. She is passing clots.  She will fill up a tampon in the time it takes her to put on her makeup.  Ultrasound shows a large fundal fibroid (see below) and her IUD apparently in place, though hard to determine.   Last pap smear: 2/21 - NILM, HPV negative  Pelvic ultrasound 04/2020 (report): Adnexa: Right ovarian cyst measuring 2.3 x 1.8 x 2. 6 cm, simple appearing.  Left ovarian cyst, measuring 2.1 x 1.8 x 2.2 cm, simple appearing.     Uterus: anteverted with endometrial stripe undetermined by me.  Additional: anterior fibroid (location in relation to the myometrium is difficult to tell, measures 7.33 x 6 x 7 cm. Mild free fluid in the cul-de-sac IUD present, appears to be in correct location.  Possible somewhat lower than the fundus, though I can not tell for sure based on my read of the images and shadowing effect of the fibroid.  She has been extensively counseled regarding alternatives to surgery (UFE, myomectomy, continue treatment with medication)  Proposed surgery: robot assisted Total Laparoscopic Hysterectomy, bilateral salpingectomy with cystoscopy  Past Medical History:  Diagnosis Date   Anxiety    Complication of anesthesia    felt like skin was crawling after D&C, needed benadryl   Menorrhagia with irregular cycle    Migraines    Night sweats    Uterine leiomyoma    Past  Surgical History:  Procedure Laterality Date   DILATION AND CURETTAGE OF UTERUS  02/25/2003   LEEP  02/24/2009   OTHER SURGICAL HISTORY     clips put on nerves 2006 (facial)   OB History  Gravida Para Term Preterm AB Living  3 2 2   1 2   SAB IAB Ectopic Multiple Live Births  1            # Outcome Date GA Lbr Len/2nd Weight Sex Delivery Anes PTL Lv  3 Term           2 Term           1 SAB           Patient denies any other pertinent gynecologic issues.   Current Outpatient Medications on File Prior to Visit  Medication Sig Dispense Refill   ALPRAZolam (XANAX) 0.5 MG tablet Take 1 tablet (0.5 mg total) by mouth daily. As needed for anxiety (Patient taking differently: Take 0.5 mg by mouth daily as needed for anxiety.) 30 tablet 0   escitalopram (LEXAPRO) 20 MG tablet TAKE 1 TABLET(20 MG) BY MOUTH DAILY 90 tablet 3   Ferrous Sulfate (IRON PO) Take 1 tablet by mouth daily.     levonorgestrel (MIRENA) 20 MCG/24HR IUD 1 each by Intrauterine route once.     No current facility-administered medications on file prior to visit.   No Known Allergies  Social History:   reports that she has never smoked. She has never used smokeless tobacco. She  reports current alcohol use. She reports that she does not use drugs.  Family History  Problem Relation Age of Onset   Diabetes Father    Anxiety disorder Sister    Irritable bowel syndrome Sister    Alcohol abuse Brother    Hypertension Brother    Hyperlipidemia Brother    Anxiety disorder Brother    Breast cancer Neg Hx     Review of Systems: Noncontributory  PHYSICAL EXAM: Blood pressure 110/70, [place] 5\' 7"  (1.702 m), weight 156 lb (70.8 kg), last menstrual period 12/30/2020. CONSTITUTIONAL: Well-developed, well-nourished female in no acute distress.  HENT:  Normocephalic, atraumatic, External right and left ear normal. Oropharynx is clear and moist EYES: Conjunctivae and EOM are normal. Pupils are equal, round, and reactive to  light. No scleral icterus.  NECK: Normal range of motion, supple, no masses SKIN: Skin is warm and dry. No rash noted. Not diaphoretic. No erythema. No pallor. [place]: Alert and oriented to person, place, and time. Normal reflexes, muscle tone coordination. No cranial nerve deficit noted. PSYCHIATRIC: Normal mood and affect. Normal behavior. Normal judgment and thought content. CARDIOVASCULAR: Normal heart rate noted, regular rhythm RESPIRATORY: Effort and breath sounds normal, no problems with respiration noted ABDOMEN: Soft, nontender, nondistended. PELVIC: Deferred MUSCULOSKELETAL: Normal range of motion. No edema and no tenderness. 2+ distal pulses.  Labs: No results found for this or any previous visit (from the past 336 hour(s)).  Imaging Studies: No results found.  Assessment:   ICD-10-CM   1. Menorrhagia with irregular cycle  N92.1     2. Uterine leiomyoma, unspecified location  D25.9     3. Dysmenorrhea  N94.6        Plan: Patient will undergo surgical management with the above-proposed surgery.   The risks of surgery were discussed in detail with the patient including but not limited to: bleeding which may require transfusion or reoperation; infection which may require antibiotics; injury to surrounding organs which may involve bowel, bladder, ureters ; need for additional procedures including laparoscopy or laparotomy; thromboembolic phenomenon, surgical site problems and other postoperative/anesthesia complications. Likelihood of success in alleviating the patient's condition was discussed. Routine postoperative instructions will be reviewed with the patient and her family in detail after surgery.  The patient concurred with the proposed plan, giving informed written consent for the surgery.  Preoperative prophylactic antibiotics, as indicated, and SCDs ordered on call to the OR.    Prentice Docker, MD 01/04/2021 10:38 AM

## 2021-01-04 NOTE — Progress Notes (Signed)
Preoperative History and Physical  Alisha Hamilton is a 45 y.o. [id number] here for surgical management of menorrhagia with irregular cycle, dysmenorrhea, uterine fibroid.   No significant preoperative concerns.  History of Present Illness: 45 y.o. G49P2012 female who presents for pre-operative visit for surgery. Alisha Hamilton has long-standing heavy menstrual bleeding associated with pain. Alisha Hamilton voids frequently.  Alisha Hamilton has had intermitted times with no menses and then bleeding episodes that would last a long time. Alisha Hamilton had an IUD placed 14 years ago for birth control (Most recently changed 3 years ago).   The bleeding is heavy. Alisha Hamilton is passing clots.  Alisha Hamilton will fill up a tampon in the time it takes her to put on her makeup.  Ultrasound shows a large fundal fibroid (see below) and her IUD apparently in place, though hard to determine.   Last pap smear: 2/21 - NILM, HPV negative  Pelvic ultrasound 04/2020 (report): Adnexa: Right ovarian cyst measuring 2.3 x 1.8 x 2. 6 cm, simple appearing.  Left ovarian cyst, measuring 2.1 x 1.8 x 2.2 cm, simple appearing.     Uterus: anteverted with endometrial stripe undetermined by me.  Additional: anterior fibroid (location in relation to the myometrium is difficult to tell, measures 7.33 x 6 x 7 cm. Mild free fluid in the cul-de-sac IUD present, appears to be in correct location.  Possible somewhat lower than the fundus, though I can not tell for sure based on my read of the images and shadowing effect of the fibroid.  Alisha Hamilton has been extensively counseled regarding alternatives to surgery (UFE, myomectomy, continue treatment with medication)  Proposed surgery: robot assisted Total Laparoscopic Hysterectomy, bilateral salpingectomy with cystoscopy  Past Medical History:  Diagnosis Date   Anxiety    Complication of anesthesia    felt like skin was crawling after D&C, needed benadryl   Menorrhagia with irregular cycle    Migraines    Night sweats    Uterine leiomyoma    Past  Surgical History:  Procedure Laterality Date   DILATION AND CURETTAGE OF UTERUS  02/25/2003   LEEP  02/24/2009   OTHER SURGICAL HISTORY     clips put on nerves 2006 (facial)   OB History  Gravida Para Term Preterm AB Living  3 2 2   1 2   SAB IAB Ectopic Multiple Live Births  1            # Outcome Date GA Lbr Len/2nd Weight Sex Delivery Anes PTL Lv  3 Term           2 Term           1 SAB           Alisha Hamilton denies any other pertinent gynecologic issues.   Current Outpatient Medications on File Prior to Visit  Medication Sig Dispense Refill   ALPRAZolam (XANAX) 0.5 MG tablet Take 1 tablet (0.5 mg total) by mouth daily. As needed for anxiety (Alisha Hamilton taking differently: Take 0.5 mg by mouth daily as needed for anxiety.) 30 tablet 0   escitalopram (LEXAPRO) 20 MG tablet TAKE 1 TABLET(20 MG) BY MOUTH DAILY 90 tablet 3   Ferrous Sulfate (IRON PO) Take 1 tablet by mouth daily.     levonorgestrel (MIRENA) 20 MCG/24HR IUD 1 each by Intrauterine route once.     No current facility-administered medications on file prior to visit.   No Known Allergies  Social History:   reports that Alisha Hamilton has never smoked. Alisha Hamilton has never used smokeless tobacco. Alisha Hamilton  reports current alcohol use. Alisha Hamilton reports that Alisha Hamilton does not use drugs.  Family History  Problem Relation Age of Onset   Diabetes Father    Anxiety disorder Sister    Irritable bowel syndrome Sister    Alcohol abuse Brother    Hypertension Brother    Hyperlipidemia Brother    Anxiety disorder Brother    Breast cancer Neg Hx     Review of Systems: Noncontributory  PHYSICAL EXAM: Blood pressure 110/70, [place] 5\' 7"  (1.702 m), weight 156 lb (70.8 kg), last menstrual period 12/30/2020. CONSTITUTIONAL: Well-developed, well-nourished female in no acute distress.  HENT:  Normocephalic, atraumatic, External right and left ear normal. Oropharynx is clear and moist EYES: Conjunctivae and EOM are normal. Pupils are equal, round, and reactive to  light. No scleral icterus.  NECK: Normal range of motion, supple, no masses SKIN: Skin is warm and dry. No rash noted. Not diaphoretic. No erythema. No pallor. [place]: Alert and oriented to person, place, and time. Normal reflexes, muscle tone coordination. No cranial nerve deficit noted. PSYCHIATRIC: Normal mood and affect. Normal behavior. Normal judgment and thought content. CARDIOVASCULAR: Normal heart rate noted, regular rhythm RESPIRATORY: Effort and breath sounds normal, no problems with respiration noted ABDOMEN: Soft, nontender, nondistended. PELVIC: Deferred MUSCULOSKELETAL: Normal range of motion. No edema and no tenderness. 2+ distal pulses.  Labs: No results found for this or any previous visit (from the past 336 hour(s)).  Imaging Studies: No results found.  Assessment:   ICD-10-CM   1. Menorrhagia with irregular cycle  N92.1     2. Uterine leiomyoma, unspecified location  D25.9     3. Dysmenorrhea  N94.6        Plan: Alisha Hamilton will undergo surgical management with the above-proposed surgery.   The risks of surgery were discussed in detail with the Alisha Hamilton including but not limited to: bleeding which may require transfusion or reoperation; infection which may require antibiotics; injury to surrounding organs which may involve bowel, bladder, ureters ; need for additional procedures including laparoscopy or laparotomy; thromboembolic phenomenon, surgical site problems and other postoperative/anesthesia complications. Likelihood of success in alleviating the Alisha Hamilton's condition was discussed. Routine postoperative instructions will be reviewed with the Alisha Hamilton and her family in detail after surgery.  The Alisha Hamilton concurred with the proposed plan, giving informed written consent for the surgery.  Preoperative prophylactic antibiotics, as indicated, and SCDs ordered on call to the OR.    Prentice Docker, MD 01/04/2021 10:38 AM

## 2021-01-09 ENCOUNTER — Telehealth: Payer: Self-pay | Admitting: Obstetrics and Gynecology

## 2021-01-09 NOTE — Telephone Encounter (Signed)
Discussed symptoms with patient. She wrote today to let me know that she has upper respiratory symptoms and wanted to know if that would affect her surgery tomorrow. She notes nasal congestion and an enlarged lymph node in her neck. Otherwise, she has no sore throat, no cough, no fever, no chills.  She sounds a little hoarse.  But, otherwise she says she feels fine. I talked with Mahlon Gammon and he stated that they would need to see her. However, usually if the patient has a productive cough, fever, or appears septic, the surgery might be cancelled.  The plan is to have her come a little earlier tomorrow in case the team wants a covid test (she had a negative home test).  The decision would be made at that time.  She understands that her surgery could be cancelled. However, based on what I was told by Dr. Theodore Demark, she does not meet the criteria for someone who would automatically be cancelled. She agreed to the plan and will arrive a little early tomorrow.

## 2021-01-10 ENCOUNTER — Ambulatory Visit: Payer: BC Managed Care – PPO | Admitting: Urgent Care

## 2021-01-10 ENCOUNTER — Ambulatory Visit
Admission: RE | Admit: 2021-01-10 | Discharge: 2021-01-10 | Disposition: A | Discharge status: Home / Self Care | Payer: BC Managed Care – PPO | Attending: Obstetrics and Gynecology | Admitting: Obstetrics and Gynecology

## 2021-01-10 ENCOUNTER — Other Ambulatory Visit: Payer: Self-pay

## 2021-01-10 ENCOUNTER — Encounter: Payer: Self-pay | Admitting: Obstetrics and Gynecology

## 2021-01-10 ENCOUNTER — Encounter
Admission: RE | Disposition: A | Discharge status: Home / Self Care | Payer: Self-pay | Source: Home / Self Care | Attending: Obstetrics and Gynecology

## 2021-01-10 DIAGNOSIS — Z87410 Personal history of cervical dysplasia: Secondary | ICD-10-CM

## 2021-01-10 DIAGNOSIS — Z975 Presence of (intrauterine) contraceptive device: Secondary | ICD-10-CM | POA: Diagnosis not present

## 2021-01-10 DIAGNOSIS — N946 Dysmenorrhea, unspecified: Secondary | ICD-10-CM | POA: Diagnosis not present

## 2021-01-10 DIAGNOSIS — D251 Intramural leiomyoma of uterus: Secondary | ICD-10-CM | POA: Insufficient documentation

## 2021-01-10 DIAGNOSIS — N83202 Unspecified ovarian cyst, left side: Secondary | ICD-10-CM | POA: Diagnosis not present

## 2021-01-10 DIAGNOSIS — D259 Leiomyoma of uterus, unspecified: Secondary | ICD-10-CM | POA: Diagnosis not present

## 2021-01-10 DIAGNOSIS — Z20822 Contact with and (suspected) exposure to covid-19: Secondary | ICD-10-CM | POA: Insufficient documentation

## 2021-01-10 DIAGNOSIS — N83201 Unspecified ovarian cyst, right side: Secondary | ICD-10-CM | POA: Diagnosis not present

## 2021-01-10 DIAGNOSIS — N921 Excessive and frequent menstruation with irregular cycle: Secondary | ICD-10-CM | POA: Diagnosis present

## 2021-01-10 HISTORY — DX: Migraine, unspecified, not intractable, without status migrainosus: G43.909

## 2021-01-10 HISTORY — DX: Excessive and frequent menstruation with irregular cycle: N92.1

## 2021-01-10 HISTORY — DX: Generalized hyperhidrosis: R61

## 2021-01-10 HISTORY — DX: Leiomyoma of uterus, unspecified: D25.9

## 2021-01-10 HISTORY — PX: ROBOTIC ASSISTED LAPAROSCOPIC HYSTERECTOMY AND SALPINGECTOMY: SHX6379

## 2021-01-10 LAB — SARS CORONAVIRUS 2 BY RT PCR (HOSPITAL ORDER, PERFORMED IN ~~LOC~~ HOSPITAL LAB): SARS Coronavirus 2: NEGATIVE

## 2021-01-10 LAB — POCT PREGNANCY, URINE: Preg Test, Ur: NEGATIVE

## 2021-01-10 LAB — ABO/RH: ABO/RH(D): O POS

## 2021-01-10 SURGERY — XI ROBOTIC ASSISTED LAPAROSCOPIC HYSTERECTOMY AND SALPINGECTOMY
Anesthesia: General | Laterality: Bilateral

## 2021-01-10 MED ORDER — ACETAMINOPHEN 10 MG/ML IV SOLN
INTRAVENOUS | Status: DC | PRN
Start: 2021-01-10 — End: 2021-01-10
  Administered 2021-01-10: 1000 mg via INTRAVENOUS

## 2021-01-10 MED ORDER — PROMETHAZINE HCL 25 MG/ML IJ SOLN: 12.5000 mg | Freq: Once | INTRAMUSCULAR | Status: AC

## 2021-01-10 MED ORDER — PROPOFOL 10 MG/ML IV BOLUS
INTRAVENOUS | Status: DC | PRN
  Administered 2021-01-10: 170 mg via INTRAVENOUS

## 2021-01-10 MED ORDER — PHENYLEPHRINE HCL (PRESSORS) 10 MG/ML IV SOLN
INTRAVENOUS | Status: DC | PRN
  Administered 2021-01-10: 880 ug via INTRAVENOUS
  Administered 2021-01-10 (×2): 160 ug via INTRAVENOUS

## 2021-01-10 MED ORDER — MIDAZOLAM HCL 2 MG/2ML IJ SOLN
INTRAMUSCULAR | Status: AC
  Filled 2021-01-10: qty 2

## 2021-01-10 MED ORDER — FAMOTIDINE 20 MG PO TABS
ORAL_TABLET | ORAL | Status: AC
  Administered 2021-01-10: 13:00:00 20 mg via ORAL
  Filled 2021-01-10: qty 1

## 2021-01-10 MED ORDER — SUGAMMADEX SODIUM 200 MG/2ML IV SOLN
INTRAVENOUS | Status: DC | PRN
  Administered 2021-01-10: 141.6 mg via INTRAVENOUS

## 2021-01-10 MED ORDER — CEFAZOLIN SODIUM-DEXTROSE 2-4 GM/100ML-% IV SOLN
INTRAVENOUS | Status: AC
  Filled 2021-01-10: qty 100

## 2021-01-10 MED ORDER — IBUPROFEN 800 MG PO TABS: 800.0000 mg | ORAL_TABLET | Freq: Three times a day (TID) | ORAL | 0 refills | Status: DC

## 2021-01-10 MED ORDER — SUCCINYLCHOLINE CHLORIDE 200 MG/10ML IV SOSY
PREFILLED_SYRINGE | INTRAVENOUS | Status: DC | PRN
  Administered 2021-01-10: 100 mg via INTRAVENOUS

## 2021-01-10 MED ORDER — ONDANSETRON HCL 4 MG/2ML IJ SOLN
INTRAMUSCULAR | Status: DC | PRN
  Administered 2021-01-10 (×3): 4 mg via INTRAVENOUS

## 2021-01-10 MED ORDER — PHENYLEPHRINE HCL-NACL 20-0.9 MG/250ML-% IV SOLN
INTRAVENOUS | Status: AC
  Filled 2021-01-10: qty 250

## 2021-01-10 MED ORDER — FENTANYL CITRATE PF 50 MCG/ML IJ SOSY
PREFILLED_SYRINGE | INTRAMUSCULAR | Status: AC
  Filled 2021-01-10: qty 1

## 2021-01-10 MED ORDER — OXYCODONE-ACETAMINOPHEN 5-325 MG PO TABS
ORAL_TABLET | ORAL | Status: AC
  Filled 2021-01-10: qty 1

## 2021-01-10 MED ORDER — ORAL CARE MOUTH RINSE: 15.0000 mL | Freq: Once | OROMUCOSAL | Status: AC

## 2021-01-10 MED ORDER — LIDOCAINE HCL (CARDIAC) PF 100 MG/5ML IV SOSY
PREFILLED_SYRINGE | INTRAVENOUS | Status: DC | PRN
  Administered 2021-01-10: 100 mg via INTRAVENOUS

## 2021-01-10 MED ORDER — BUPIVACAINE HCL 0.5 % IJ SOLN
INTRAMUSCULAR | Status: DC | PRN
  Administered 2021-01-10: 10 mL

## 2021-01-10 MED ORDER — DEXAMETHASONE SODIUM PHOSPHATE 10 MG/ML IJ SOLN
INTRAMUSCULAR | Status: AC
  Filled 2021-01-10: qty 1

## 2021-01-10 MED ORDER — DEXMEDETOMIDINE (PRECEDEX) IN NS 20 MCG/5ML (4 MCG/ML) IV SYRINGE
PREFILLED_SYRINGE | INTRAVENOUS | Status: DC | PRN
  Administered 2021-01-10: 12 ug via INTRAVENOUS

## 2021-01-10 MED ORDER — LACTATED RINGERS IV SOLN: INTRAVENOUS | Status: DC

## 2021-01-10 MED ORDER — ROCURONIUM BROMIDE 10 MG/ML (PF) SYRINGE
PREFILLED_SYRINGE | INTRAVENOUS | Status: AC
  Filled 2021-01-10: qty 10

## 2021-01-10 MED ORDER — HYDROMORPHONE HCL 1 MG/ML IJ SOLN
0.2500 mg | INTRAMUSCULAR | Status: DC | PRN
Start: 2021-01-10 — End: 2021-01-11

## 2021-01-10 MED ORDER — LIDOCAINE HCL (PF) 2 % IJ SOLN
INTRAMUSCULAR | Status: AC
  Filled 2021-01-10: qty 5

## 2021-01-10 MED ORDER — SEVOFLURANE IN SOLN
RESPIRATORY_TRACT | Status: AC
  Filled 2021-01-10: qty 250

## 2021-01-10 MED ORDER — DEXAMETHASONE SODIUM PHOSPHATE 10 MG/ML IJ SOLN
INTRAMUSCULAR | Status: DC | PRN
  Administered 2021-01-10: 5 mg via INTRAVENOUS

## 2021-01-10 MED ORDER — BUPIVACAINE HCL (PF) 0.5 % IJ SOLN
INTRAMUSCULAR | Status: AC
  Filled 2021-01-10: qty 30

## 2021-01-10 MED ORDER — ROCURONIUM BROMIDE 100 MG/10ML IV SOLN
INTRAVENOUS | Status: DC | PRN
  Administered 2021-01-10: 30 mg via INTRAVENOUS
  Administered 2021-01-10 (×2): 20 mg via INTRAVENOUS
  Administered 2021-01-10: 5 mg via INTRAVENOUS
  Administered 2021-01-10: 20 mg via INTRAVENOUS
  Administered 2021-01-10: 30 mg via INTRAVENOUS

## 2021-01-10 MED ORDER — ACETAMINOPHEN 10 MG/ML IV SOLN
INTRAVENOUS | Status: AC
  Filled 2021-01-10: qty 100

## 2021-01-10 MED ORDER — MIDAZOLAM HCL 2 MG/2ML IJ SOLN
INTRAMUSCULAR | Status: DC | PRN
Start: 2021-01-10 — End: 2021-01-10
  Administered 2021-01-10: 2 mg via INTRAVENOUS

## 2021-01-10 MED ORDER — OXYCODONE-ACETAMINOPHEN 5-325 MG PO TABS: 1.0000 | ORAL_TABLET | Freq: Once | ORAL | Status: DC

## 2021-01-10 MED ORDER — OXYCODONE-ACETAMINOPHEN 5-325 MG PO TABS: 1.0000 | ORAL_TABLET | Freq: Four times a day (QID) | ORAL | 0 refills | Status: AC | PRN

## 2021-01-10 MED ORDER — ONDANSETRON HCL 4 MG/2ML IJ SOLN
INTRAMUSCULAR | Status: AC
  Filled 2021-01-10: qty 2

## 2021-01-10 MED ORDER — SODIUM CHLORIDE 0.9 % IR SOLN
Status: DC | PRN
  Administered 2021-01-10: 200 mL

## 2021-01-10 MED ORDER — CEFAZOLIN SODIUM-DEXTROSE 2-4 GM/100ML-% IV SOLN
2.0000 g | INTRAVENOUS | Status: AC
  Administered 2021-01-10: 15:00:00 2 g via INTRAVENOUS

## 2021-01-10 MED ORDER — PROPOFOL 10 MG/ML IV BOLUS
INTRAVENOUS | Status: AC
  Filled 2021-01-10: qty 20

## 2021-01-10 MED ORDER — 0.9 % SODIUM CHLORIDE (POUR BTL) OPTIME
TOPICAL | Status: DC | PRN
  Administered 2021-01-10: 15:00:00 250 mL

## 2021-01-10 MED ORDER — FENTANYL CITRATE (PF) 100 MCG/2ML IJ SOLN
INTRAMUSCULAR | Status: AC
  Filled 2021-01-10: qty 2

## 2021-01-10 MED ORDER — CHLORHEXIDINE GLUCONATE 0.12 % MT SOLN
OROMUCOSAL | Status: AC
  Administered 2021-01-10: 13:00:00 15 mL via OROMUCOSAL
  Filled 2021-01-10: qty 15

## 2021-01-10 MED ORDER — FENTANYL CITRATE (PF) 100 MCG/2ML IJ SOLN
INTRAMUSCULAR | Status: DC | PRN
  Administered 2021-01-10 (×4): 50 ug via INTRAVENOUS

## 2021-01-10 MED ORDER — ONDANSETRON 4 MG PO TBDP: 4.0000 mg | ORAL_TABLET | Freq: Three times a day (TID) | ORAL | 0 refills | Status: DC | PRN

## 2021-01-10 MED ORDER — HYDROCODONE-ACETAMINOPHEN 7.5-325 MG PO TABS
1.0000 | ORAL_TABLET | Freq: Once | ORAL | Status: DC | PRN
  Filled 2021-01-10: qty 1

## 2021-01-10 MED ORDER — POVIDONE-IODINE 10 % EX SWAB
2.0000 "application " | Freq: Once | CUTANEOUS | Status: AC
  Administered 2021-01-10: 2 via TOPICAL

## 2021-01-10 MED ORDER — FENTANYL CITRATE (PF) 100 MCG/2ML IJ SOLN
INTRAMUSCULAR | Status: AC
  Administered 2021-01-10: 19:00:00 25 ug
  Filled 2021-01-10: qty 2

## 2021-01-10 MED ORDER — CHLORHEXIDINE GLUCONATE 0.12 % MT SOLN: 15.0000 mL | Freq: Once | OROMUCOSAL | Status: AC

## 2021-01-10 MED ORDER — PROMETHAZINE HCL 25 MG/ML IJ SOLN
INTRAMUSCULAR | Status: AC
  Administered 2021-01-10: 19:00:00 12.5 mg via INTRAVENOUS
  Filled 2021-01-10: qty 1

## 2021-01-10 MED ORDER — FAMOTIDINE 20 MG PO TABS: 20.0000 mg | ORAL_TABLET | Freq: Once | ORAL | Status: AC

## 2021-01-10 SURGICAL SUPPLY — 69 items
ADH SKN CLS APL DERMABOND .7 (GAUZE/BANDAGES/DRESSINGS) ×1
APL PRP STRL LF DISP 70% ISPRP (MISCELLANEOUS) ×1
BAG DRN RND TRDRP ANRFLXCHMBR (UROLOGICAL SUPPLIES)
BAG URINE DRAIN 2000ML AR STRL (UROLOGICAL SUPPLIES) IMPLANT
BLADE SURG 10 STRL SS (BLADE) ×2 IMPLANT
BLADE SURG SZ11 CARB STEEL (BLADE) ×2 IMPLANT
CANNULA CAP OBTURATR AIRSEAL 8 (CAP) ×2 IMPLANT
CATH FOLEY 2WAY  5CC 16FR (CATHETERS) ×1
CATH FOLEY 2WAY 5CC 16FR (CATHETERS) ×1
CATH URTH 16FR FL 2W BLN LF (CATHETERS) ×1 IMPLANT
CHLORAPREP W/TINT 26 (MISCELLANEOUS) ×2 IMPLANT
COVER MAYO STAND REUSABLE (DRAPES) ×2 IMPLANT
COVER TIP SHEARS 8 DVNC (MISCELLANEOUS) ×1 IMPLANT
COVER TIP SHEARS 8MM DA VINCI (MISCELLANEOUS) ×1
DEFOGGER SCOPE WARMER CLEARIFY (MISCELLANEOUS) ×2 IMPLANT
DERMABOND ADVANCED (GAUZE/BANDAGES/DRESSINGS) ×1
DERMABOND ADVANCED .7 DNX12 (GAUZE/BANDAGES/DRESSINGS) ×1 IMPLANT
DRAPE 3/4 80X56 (DRAPES) IMPLANT
DRAPE ARM DVNC X/XI (DISPOSABLE) ×4 IMPLANT
DRAPE DA VINCI XI ARM (DISPOSABLE) ×4
DRAPE ROBOT W/ LEGGING 30X125 (DRAPES) ×2 IMPLANT
DRAPE UNDER BUTTOCK W/FLU (DRAPES) ×2 IMPLANT
ELECT REM PT RETURN 9FT ADLT (ELECTROSURGICAL) ×2
ELECTRODE REM PT RTRN 9FT ADLT (ELECTROSURGICAL) ×1 IMPLANT
GAUZE 4X4 16PLY ~~LOC~~+RFID DBL (SPONGE) ×4 IMPLANT
GLOVE SURG ENC MOIS LTX SZ7 (GLOVE) ×6 IMPLANT
GLOVE SURG UNDER POLY LF SZ7.5 (GLOVE) ×6 IMPLANT
GOWN STRL REUS W/ TWL LRG LVL3 (GOWN DISPOSABLE) ×3 IMPLANT
GOWN STRL REUS W/TWL LRG LVL3 (GOWN DISPOSABLE) ×6
IRRIGATION STRYKERFLOW (MISCELLANEOUS) IMPLANT
IRRIGATOR STRYKERFLOW (MISCELLANEOUS)
IV NS 1000ML (IV SOLUTION) ×2
IV NS 1000ML BAXH (IV SOLUTION) ×1 IMPLANT
KIT PINK PAD W/HEAD ARE REST (MISCELLANEOUS) ×2
KIT PINK PAD W/HEAD ARM REST (MISCELLANEOUS) ×1 IMPLANT
KIT TURNOVER CYSTO (KITS) ×2 IMPLANT
LABEL OR SOLS (LABEL) ×2 IMPLANT
MANIFOLD NEPTUNE II (INSTRUMENTS) IMPLANT
MANIPULATOR VCARE LG CRV RETR (MISCELLANEOUS) ×2 IMPLANT
MANIPULATOR VCARE SML CRV RETR (MISCELLANEOUS) IMPLANT
MANIPULATOR VCARE STD CRV RETR (MISCELLANEOUS) IMPLANT
NEEDLE HYPO 22GX1.5 SAFETY (NEEDLE) ×2 IMPLANT
NS IRRIG 500ML POUR BTL (IV SOLUTION) ×2 IMPLANT
OBTURATOR OPTICAL STANDARD 8MM (TROCAR) ×2
OBTURATOR OPTICAL STND 8 DVNC (TROCAR) ×2
OBTURATOR OPTICALSTD 8 DVNC (TROCAR) ×2 IMPLANT
OCCLUDER COLPOPNEUMO (BALLOONS) ×2 IMPLANT
PACK LAP CHOLECYSTECTOMY (MISCELLANEOUS) ×2 IMPLANT
PAD OB MATERNITY 4.3X12.25 (PERSONAL CARE ITEMS) ×2 IMPLANT
PAD PREP 24X41 OB/GYN DISP (PERSONAL CARE ITEMS) ×2 IMPLANT
PENCIL ELECTRO HAND CTR (MISCELLANEOUS) ×2 IMPLANT
SCISSORS METZENBAUM CVD 33 (INSTRUMENTS) IMPLANT
SCRUB EXIDINE 4% CHG 4OZ (MISCELLANEOUS) ×2 IMPLANT
SEAL CANN UNIV 5-8 DVNC XI (MISCELLANEOUS) ×3 IMPLANT
SEAL XI 5MM-8MM UNIVERSAL (MISCELLANEOUS) ×3
SEALER VESSEL DA VINCI XI (MISCELLANEOUS) ×1
SEALER VESSEL EXT DVNC XI (MISCELLANEOUS) ×1 IMPLANT
SET TUBE FILTERED XL AIRSEAL (SET/KITS/TRAYS/PACK) ×2 IMPLANT
SOLUTION ELECTROLUBE (MISCELLANEOUS) ×2 IMPLANT
SPONGE T-LAP 18X18 ~~LOC~~+RFID (SPONGE) IMPLANT
SURGILUBE 2OZ TUBE FLIPTOP (MISCELLANEOUS) ×2 IMPLANT
SUT MNCRL 4-0 (SUTURE) ×4
SUT MNCRL 4-0 27XMFL (SUTURE) ×2
SUT STRATAFIX 0 PDS+ CT-2 23 (SUTURE) ×2
SUTURE MNCRL 4-0 27XMF (SUTURE) ×2 IMPLANT
SUTURE STRATFX 0 PDS+ CT-2 23 (SUTURE) ×1 IMPLANT
SYR 10ML LL (SYRINGE) ×2 IMPLANT
SYR 50ML LL SCALE MARK (SYRINGE) ×2 IMPLANT
WATER STERILE IRR 500ML POUR (IV SOLUTION) ×2 IMPLANT

## 2021-01-10 NOTE — Anesthesia Preprocedure Evaluation (Signed)
Anesthesia Evaluation  Patient identified by MRN, date of birth, ID band Patient awake    Reviewed: Allergy & Precautions, NPO status , Patient's Chart, lab work & pertinent test results  Airway Mallampati: II  TM Distance: >3 FB Neck ROM: full    Dental no notable dental hx. (+) Chipped   Pulmonary neg pulmonary ROS,    Pulmonary exam normal        Cardiovascular negative cardio ROS Normal cardiovascular exam     Neuro/Psych negative neurological ROS  negative psych ROS   GI/Hepatic negative GI ROS, Neg liver ROS,   Endo/Other  negative endocrine ROS  Renal/GU      Musculoskeletal   Abdominal Normal abdominal exam  (+)   Peds  Hematology negative hematology ROS (+)   Anesthesia Other Findings Past Medical History: No date: Anxiety No date: Complication of anesthesia     Comment:  felt like skin was crawling after D&C, needed benadryl No date: Menorrhagia with irregular cycle No date: Migraines No date: Night sweats No date: Uterine leiomyoma  Past Surgical History: 02/25/2003: DILATION AND CURETTAGE OF UTERUS 02/24/2009: LEEP No date: OTHER SURGICAL HISTORY     Comment:  clips put on nerves 2006 (facial)  BMI    Body Mass Index: 24.45 kg/m      Reproductive/Obstetrics negative OB ROS                             Anesthesia Physical Anesthesia Plan  ASA: 2  Anesthesia Plan: General ETT   Post-op Pain Management:    Induction: Intravenous  PONV Risk Score and Plan: Ondansetron, Dexamethasone, Midazolam and Treatment may vary due to age or medical condition  Airway Management Planned: Oral ETT  Additional Equipment:   Intra-op Plan:   Post-operative Plan: Extubation in OR  Informed Consent: I have reviewed the patients History and Physical, chart, labs and discussed the procedure including the risks, benefits and alternatives for the proposed anesthesia with the  patient or authorized representative who has indicated his/her understanding and acceptance.     Dental Advisory Given  Plan Discussed with: Anesthesiologist, CRNA and Surgeon  Anesthesia Plan Comments: (Patient consented for risks of anesthesia including but not limited to:  - adverse reactions to medications - damage to eyes, teeth, lips or other oral mucosa - nerve damage due to positioning  - sore throat or hoarseness - Damage to heart, brain, nerves, lungs, other parts of body or loss of life  Patient voiced understanding.)        Anesthesia Quick Evaluation

## 2021-01-10 NOTE — Transfer of Care (Signed)
Immediate Anesthesia Transfer of Care Note  Patient: Alisha Hamilton  Procedure(s) Performed: Procedure(s): XI ROBOTIC ASSISTED LAPAROSCOPIC HYSTERECTOMY AND SALPINGECTOMY; REMOVAL OF IUD; CYSTOSCOPY (Bilateral)  Patient Location: PACU  Anesthesia Type:General  Level of Consciousness: sedated  Airway & Oxygen Therapy: Patient Spontanous Breathing and Patient connected to face mask oxygen  Post-op Assessment: Report given to RN and Post -op Vital signs reviewed and stable  Post vital signs: Reviewed and stable  Last Vitals:  Vitals:   01/10/21 1213 01/10/21 1843  BP: (!) 133/98 (!) 84/69  Pulse: 99 90  Resp: 20 14  Temp: 37.2 C 36.8 C  SpO2: 53% 005%    Complications: No apparent anesthesia complications

## 2021-01-10 NOTE — Op Note (Signed)
Operative Note    Name: Alisha Hamilton  Date of Service: 01/10/2021  DOB: 1975-12-29  MRN: [id number]   Pre-Operative Diagnosis:  1) Menorrhagia with irregular cycle [N92.1] 2) Uterine Fibroid, unspecified [D25.9] 3) Dysmenorrhea [N94.6]  Post-Operative Diagnosis:  1) Menorrhagia with irregular cycle [N92.1] 2) Uterine Fibroid, unspecified [D25.9] 3) Dysmenorrhea [N94.6]  Procedures:  1) Robot assisted total laparoscopic hysterectomy with bilateral salpingectomy 2) Cystoscopy  Primary Surgeon: Prentice Docker, MD   EBL: 100 mL   IVF: 2,500 mL   Urine output: 400 mL clear urine at the end of the case  Specimens: Uterus with cervix, bilateral fallopian tubes, fibroid with uterus  Drains: None  Complications: None   Disposition: PACU   Condition: Stable   Findings:  1) grossly enlarged uterus in the fundal region. 2) normal-appearing bilateral fallopian tubes and cervix.  IUD located in uterus and removed at the beginning of the procedure 3) normal cystoscopy without evidence of damage to the bladder wall.  E flux of urine from the bilateral ureteral orifices. 4) Specimen with weight of 433 grams as measured in the operating room.  Procedure Summary:  The patient was taken to the operating room where general anesthesia was administered and found to be adequate. She was placed in the dorsal supine lithotomy position in [place] stirrups and prepped and draped in the usual sterile fashion. After a timeout was called an indwelling catheter was placed in her bladder.  A sterile speculum was placed in her vagina.  The anterior lip of the cervix was grasped with the single-tooth tenaculum after removal of the intrauterine device.  The cervix was serially dilated to an 11 Pratt dilator.  The large Vcare device was placed in accordance to the manufacturer's recommendations. The speculum and tenaculum was removed.   Attention was turned to the abdomen where after injection of local  anesthetic, an 8 mm supraumbilical incision was made with the scalpel. Entry into the abdomen was obtained via Optiview trocar technique (a blunt entry technique with camera visualization through the obturator upon entry). Verification of entry into the abdomen was obtained using opening pressures. The abdomen was insufflated with CO2. The camera was introduced through the trocar with verification of atraumatic entry.  Right and left abdominal entry sites were created after injection of local anesthetic about 8 cm lateral to the umbilical port in accordance with the Intuitive manufacturer's recommendations.  An additional port was placed 8 cm lateral to the right abdominal port with verification of clearance above the iliac crest by more than 2 cm.  The port sites were 8 mm.  The intuitive trochars were introduced under intra-abdominal camera visualization without difficulty   The XI robot was docked on the patient's left.  Clearance was verified from the patient's legs.  Through the umbilical port the camera was placed.  Through the port attached to arm 3 the monopolar scissors were placed.  Through the port attached to arm 4 the forced bipolar forceps were was placed.  The vessel sealer was attached to port 1.   After inspection of the abdomen and pelvis with the above-noted findings, the bilateral ureters were identified and found to be well away from the operative area of interest. The right fallopian tube was grasped at the fimbriated end and was transected using the vessel sealer along the mesosalpinx in a lateral to medial fashion. The vessel sealer was used to transect the right round ligament and the utero-ovarian ligament was transected. Tissue was divided along the  right broad ligament to the level of the interior cervical os. The bladder tissue was dissected off the lower uterine segment and cervix without difficulty. The right uterine artery was skeletonized and identified and after ligation was  transected with the Vessel Sealer device. The same procedure was carried out on the left side. The colpotomy was performed using monopolar electrocautery in a circumferential fashion following the KOH ring.   Given the very large size of the uterus, and low concern for uterine cancer, the uterus was bivalved through the vagina and removed in pieces.  The fallopian tubes were also removed at this time.   Closure of the vaginal cuff was undertaken using the Stratfix stitch in a running fashion. All vascular pedicles were inspected and found to be hemostatic.  All instruments removed from the robotic ports.  The robot was undocked from the patient.  The abdomen was then desufflated of CO2.  All trochars were then removed.  All skin incisions were closed using 4-0 Vicryl in a subcuticular fashion and reinforced using surgical skin glue.   Cystoscopy was undertaken at this point. The Foley catheter was removed and the 30 cystoscope was gently introduced through the urethra. The bladder survey was undertaken with efflux of urine from both orifices noted. There were no defects noted in the bladder wall. The cystoscope was removed and the Foley catheter was utilized to fully empty the bladder. The catheter was removed.  The patient tolerated the procedure well.  Sponge, lap, needle, and instrument counts were correct x 2.  VTE prophylaxis: SCDs. Antibiotic prophylaxis: Ancef 2 grams at the beginning of the procedure. She was awakened in the operating room and was taken to the PACU in stable condition.   Prentice Docker, MD 01/10/2021 6:35 PM

## 2021-01-10 NOTE — Discharge Instructions (Signed)

## 2021-01-10 NOTE — Interval H&P Note (Signed)
History and Physical Interval Note:  01/10/2021 1:14 PM  Alisha Hamilton  has presented today for surgery, with the diagnosis of Menorrhagia wit irregular cycle Uterine leiomyoma, unspecified location Dysmenorrhea.  The various methods of treatment have been discussed with the patient and family. After consideration of risks, benefits and other options for treatment, the patient has consented to  Procedure(s): XI ROBOTIC ASSISTED LAPAROSCOPIC HYSTERECTOMY AND SALPINGECTOMY (Bilateral) with cystoscopy as a surgical intervention.  The patient's history has been reviewed, patient examined, no change in status, stable for surgery.  I have reviewed the patient's chart and labs.  Questions were answered to the patient's satisfaction.      Prentice Docker, MD, [facility], [facility] 01/10/2021 1:14 PM

## 2021-01-10 NOTE — Anesthesia Procedure Notes (Addendum)
Procedure Name: Intubation Date/Time: 01/10/2021 2:46 PM Performed by: Doreen Salvage, CRNA Pre-anesthesia Checklist: Patient identified, Patient being monitored, Timeout performed, Emergency Drugs available and Suction available Patient Re-evaluated:Patient Re-evaluated prior to induction Oxygen Delivery Method: Circle system utilized Preoxygenation: Pre-oxygenation with 100% oxygen Induction Type: IV induction Ventilation: Mask ventilation without difficulty Laryngoscope Size: 3 and Miller Grade View: Grade I Tube type: Oral Tube size: 7.0 mm Number of attempts: 1 Airway Equipment and Method: Stylet Placement Confirmation: ETT inserted through vocal cords under direct vision, positive ETCO2 and breath sounds checked- equal and bilateral Secured at: 21 cm Tube secured with: Tape Dental Injury: Teeth and Oropharynx as per pre-operative assessment

## 2021-01-10 NOTE — Progress Notes (Signed)
Patient with congestion/runny nose/sorethroat since Sunday. Awaiting covid result and anesthesia review before proceeding with surgery

## 2021-01-11 ENCOUNTER — Encounter: Payer: Self-pay | Admitting: Obstetrics and Gynecology

## 2021-01-14 LAB — SURGICAL PATHOLOGY

## 2021-01-15 ENCOUNTER — Ambulatory Visit (INDEPENDENT_AMBULATORY_CARE_PROVIDER_SITE_OTHER): Payer: BC Managed Care – PPO | Admitting: Obstetrics and Gynecology

## 2021-01-15 ENCOUNTER — Encounter: Payer: Self-pay | Admitting: Obstetrics and Gynecology

## 2021-01-15 ENCOUNTER — Other Ambulatory Visit: Payer: Self-pay

## 2021-01-15 VITALS — BP 120/80 | Ht 67.0 in | Wt 155.0 lb

## 2021-01-15 DIAGNOSIS — Z09 Encounter for follow-up examination after completed treatment for conditions other than malignant neoplasm: Secondary | ICD-10-CM

## 2021-01-15 NOTE — Progress Notes (Signed)
   Postoperative Follow-up Patient presents post op from Robot assisted Total Laparoscopic Hysterectomy, bilateral salpingectomy, cystoscopy 5 days ago for menorrhagia with irregular cycle, uterine fibroids, dysmenorrhea.  Subjective: Patient reports some improvement in her preop symptoms. Eating a regular diet without difficulty. The patient is not having any pain.  Activity: increasing slowly.  She denies fever, chills, nausea, and vomiting.  She denies vaginal bleeding.  SURGICAL PATHOLOGY  REPORT  DIAGNOSIS:  A. UTERUS AND CERVIX WITH BILATERAL FALLOPIAN TUBES:  - CERVIX:       - NEGATIVE FOR DYSPLASIA AND MALIGNANCY.  - ENDOMETRIUM:       - INACTIVE.  NEGATIVE FOR ATYPIA / EIN AND MALIGNANCY.  - MYOMETRIUM:       - LEIOMYOMA.  - FALLOPIAN TUBES:       - NO SIGNIFICANT PATHOLOGIC ALTERATION.  - MEDICAL DEVICE, GROSSLY CONSISTENT WITH INTRAUTERINE DEVICE.   Weight: 415 grams   Objective: Vital Signs: BP 120/80   Ht 5\' 7"  (1.702 m)   Wt 155 lb (70.3 kg)   LMP 01/09/2021 Comment: IUD removed during surgical procedure 01/10/2021 with Dr. Marisue Brooklyn.  BMI 24.28 kg/m  Physical Exam Constitutional:      General: She is not in acute distress.    Appearance: Normal appearance.  HENT:     Head: Normocephalic and atraumatic.  Eyes:     General: No scleral icterus.    Conjunctiva/sclera: Conjunctivae normal.  Abdominal:     General: Bowel sounds are normal. There is no distension.     Palpations: Abdomen is soft.     Tenderness: There is no abdominal tenderness. There is no guarding or rebound.     Comments: Incisions: without erythema, induration, warmth, and tenderness. They are clean, dry, and intact.   Neurological:     General: No focal deficit present.     Mental Status: She is alert and oriented to person, place, and time.     Cranial Nerves: No cranial nerve deficit.  Psychiatric:        Mood and Affect: Mood normal.        Behavior: Behavior normal.         Judgment: Judgment normal.     Assessment: 45 y.o. s/p above surgery progressing well  Plan: Patient has done well after surgery with no apparent complications.  I have discussed the post-operative course to date, and the expected progress moving forward.  The patient understands what complications to be concerned about.  I will see the patient in routine follow up, or sooner if needed.    Activity plan: increase slowly Discussed that I recommend no vaginal intercourse for a total of 12 weeks.  Prentice Docker, MD  01/15/2021, 3:22 PM

## 2021-01-21 NOTE — Anesthesia Postprocedure Evaluation (Signed)
Anesthesia Post Note  Patient: Alisha Hamilton  Procedure(s) Performed: XI ROBOTIC ASSISTED LAPAROSCOPIC HYSTERECTOMY AND SALPINGECTOMY; REMOVAL OF IUD; CYSTOSCOPY (Bilateral)  Patient location during evaluation: PACU Anesthesia Type: General Level of consciousness: awake and alert Pain management: pain level controlled Vital Signs Assessment: post-procedure vital signs reviewed and stable Respiratory status: spontaneous breathing, nonlabored ventilation, respiratory function stable and patient connected to nasal cannula oxygen Cardiovascular status: blood pressure returned to baseline and stable Postop Assessment: no apparent nausea or vomiting Anesthetic complications: no   No notable events documented.   Last Vitals:  Vitals:   01/10/21 1930 01/10/21 2000  BP: 122/76 118/87  Pulse: 86 80  Resp: 16 18  Temp: (!) 36.3 C 36.4 C  SpO2: 97% 96%    Last Pain:  Vitals:   01/11/21 1008  TempSrc:   PainSc: 0-No pain                 Molli Barrows

## 2021-04-29 ENCOUNTER — Other Ambulatory Visit: Payer: Self-pay

## 2021-04-29 ENCOUNTER — Telehealth: Payer: Self-pay | Admitting: Physician Assistant

## 2021-04-29 DIAGNOSIS — F419 Anxiety disorder, unspecified: Secondary | ICD-10-CM

## 2021-04-29 MED ORDER — ESCITALOPRAM OXALATE 20 MG PO TABS: ORAL_TABLET | ORAL | 0 refills | Status: DC

## 2021-04-29 NOTE — Telephone Encounter (Signed)
[facility] refill request for the following medications:   escitalopram (LEXAPRO) 20 MG tablet   Please advise.  

## 2021-07-22 ENCOUNTER — Other Ambulatory Visit: Payer: Self-pay | Admitting: Family Medicine

## 2021-07-22 DIAGNOSIS — F419 Anxiety disorder, unspecified: Secondary | ICD-10-CM

## 2021-07-23 NOTE — Progress Notes (Unsigned)
     I,Sha'taria Tyson,acting as a Education administrator for [employer], PA-C.,have documented all relevant documentation on the behalf of Mikey Kirschner, PA-C,as directed by  Mikey Kirschner, PA-C while in the presence of Mikey Kirschner, PA-C.   Established patient visit   Patient: Alisha Hamilton   DOB: Sep 09, 1975   46 y.o. Female  MRN: [id number] Visit Date: 07/24/2021  Today's healthcare provider: Mikey Kirschner, PA-C   No chief complaint on file.  Subjective    HPI  -Tetanus? -Colonoscopy? Anxiety, Follow-up  She was last seen for anxiety 14 months ago. Changes made at last visit include continue current treatment.   She reports {excellent/good/fair/poor:19665} compliance with treatment. She reports {good/fair/poor:18685} tolerance of treatment. She {is/is not:21021397} having side effects. {document side effects if present:1}  She feels her anxiety is {Desc; severity:60313} and {improved/worse/unchanged:3041574} since last visit.  Symptoms: {Yes/No:20286} chest pain {Yes/No:20286} difficulty concentrating  {Yes/No:20286} dizziness {Yes/No:20286} fatigue  {Yes/No:20286} feelings of losing control {Yes/No:20286} insomnia  {Yes/No:20286} irritable {Yes/No:20286} palpitations  {Yes/No:20286} panic attacks {Yes/No:20286} racing thoughts  {Yes/No:20286} shortness of breath {Yes/No:20286} sweating  {Yes/No:20286} tremors/shakes    GAD-7 Results    04/26/2020    4:11 PM 06/01/2019    2:00 PM  GAD-7 Generalized Anxiety Disorder Screening Tool  1. Feeling Nervous, Anxious, or on Edge 0 0  2. Not Being Able to Stop or Control Worrying 0 0  3. Worrying Too Much About Different Things 0 0  4. Trouble Relaxing 0 0  5. Being So Restless it's Hard To Sit Still 0 0  6. Becoming Easily Annoyed or Irritable 0 0  7. Feeling Afraid As If Something Awful Might Happen 0 0  Total GAD-7 Score 0 0  Difficulty At Work, Home, or Getting  Along With Others? Not difficult at all Not difficult at all     PHQ-9 Scores    06/01/2019    2:00 PM  PHQ9 SCORE ONLY  PHQ-9 Total Score 1    ---------------------------------------------------------------------------------------------------   Medications: Outpatient Medications Prior to Visit  Medication Sig   escitalopram (LEXAPRO) 20 MG tablet TAKE 1 TABLET(20 MG) BY MOUTH DAILY   Ferrous Sulfate (IRON PO) Take 1 tablet by mouth daily.   ibuprofen (ADVIL) 800 MG tablet Take 1 tablet (800 mg total) by mouth every 8 (eight) hours.   ondansetron (ZOFRAN ODT) 4 MG disintegrating tablet Take 1 tablet (4 mg total) by mouth every 8 (eight) hours as needed for nausea or vomiting.   No facility-administered medications prior to visit.    Review of Systems  {Labs  Heme  Chem  Endocrine  Serology  Results Review (optional):23779}   Objective    LMP 01/09/2021 Comment: IUD removed during surgical procedure 01/10/2021 with Dr. Marisue Brooklyn. {Show previous vital signs (optional):23777}  Physical Exam  ***  No results found for any visits on 07/24/21.  Assessment & Plan     ***  No follow-ups on file.      {provider attestation***:1}   Mikey Kirschner, PA-C  [facility] [phone] (phone) [phone] (fax)  [place]

## 2021-07-24 ENCOUNTER — Encounter: Payer: Self-pay | Admitting: Physician Assistant

## 2021-07-24 ENCOUNTER — Ambulatory Visit: Payer: BC Managed Care – PPO | Admitting: Physician Assistant

## 2021-07-24 VITALS — BP 116/80 | HR 75 | Ht 67.0 in | Wt 154.2 lb

## 2021-07-24 DIAGNOSIS — Z1211 Encounter for screening for malignant neoplasm of colon: Secondary | ICD-10-CM | POA: Diagnosis not present

## 2021-07-24 DIAGNOSIS — Z1231 Encounter for screening mammogram for malignant neoplasm of breast: Secondary | ICD-10-CM

## 2021-07-24 DIAGNOSIS — Z23 Encounter for immunization: Secondary | ICD-10-CM | POA: Diagnosis not present

## 2021-07-24 DIAGNOSIS — F419 Anxiety disorder, unspecified: Secondary | ICD-10-CM | POA: Diagnosis not present

## 2021-07-24 DIAGNOSIS — Z90710 Acquired absence of both cervix and uterus: Secondary | ICD-10-CM | POA: Insufficient documentation

## 2021-07-24 IMAGING — US US PELVIS COMPLETE WITH TRANSVAGINAL
1 series · 13 of 25 positions shown · non-contrast
Comparison: None

CLINICAL DATA: Menorrhagia with irregular menstrual cycle.
Fibroids.



[Series 1: us pelvis complete with transvaginal · 0.22mm/px · 123 acquisitions, 13 frames shown]
[im 1/123]
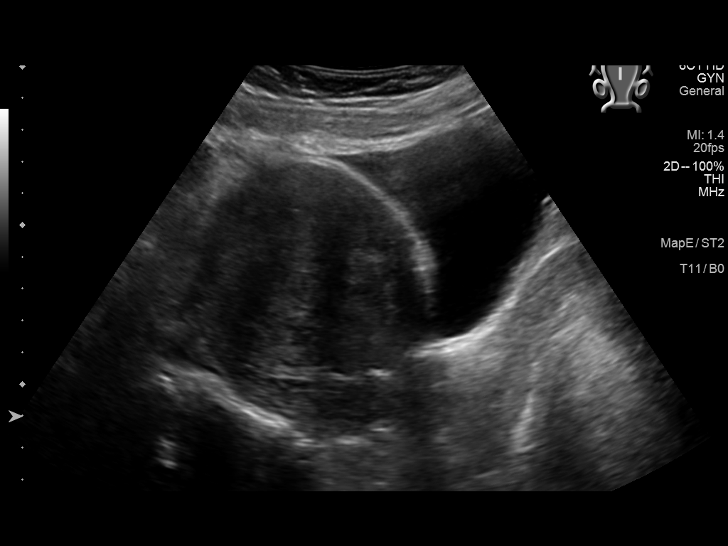
[im 11/123]
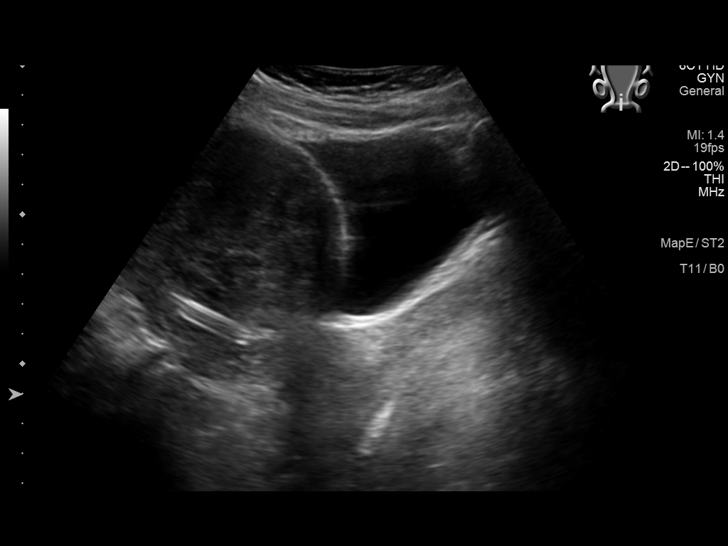
[im 21/123]
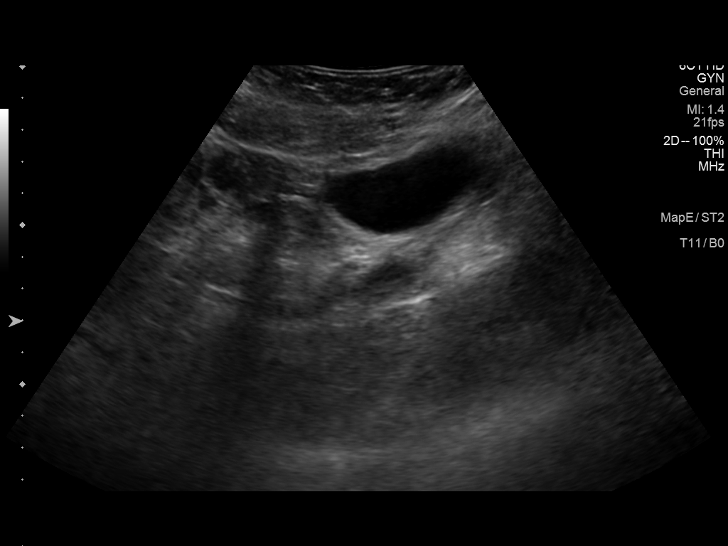
[im 31/123]
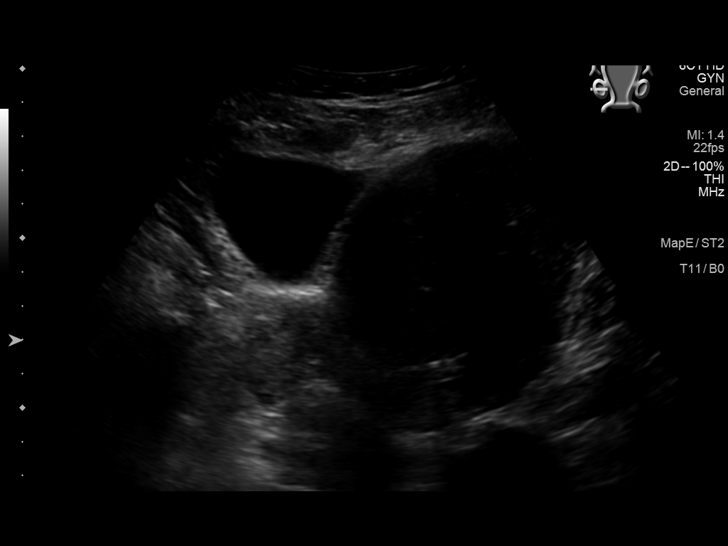
[im 41/123]
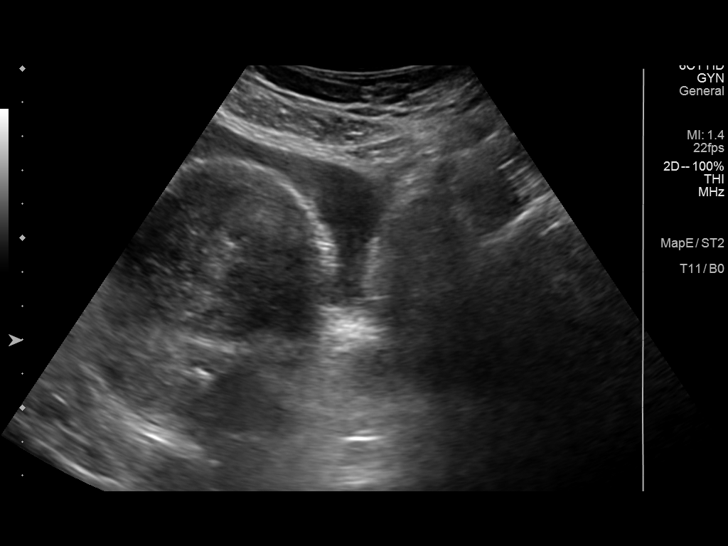
[im 51/123]
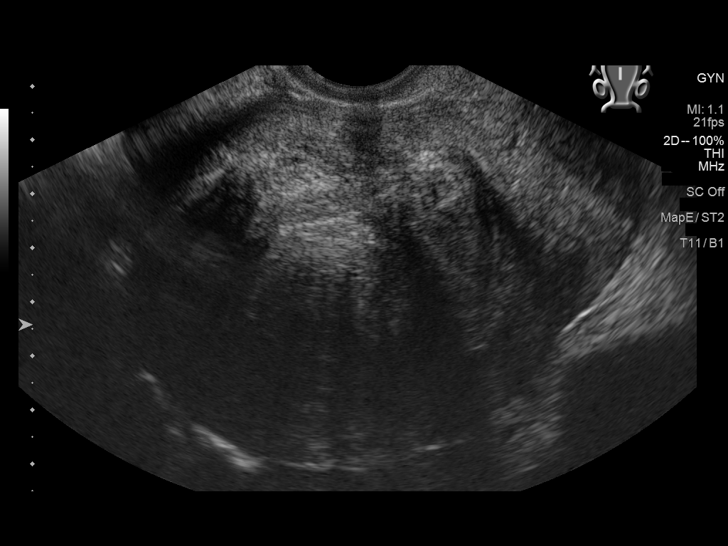
[im 62/123]
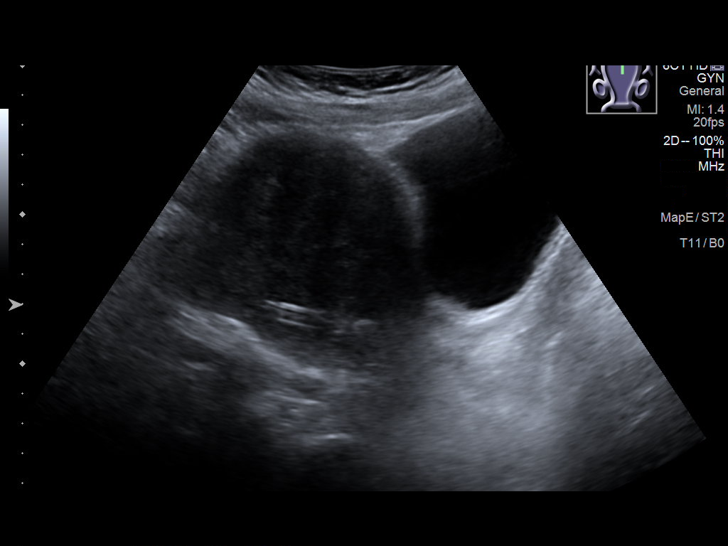
[im 72/123]
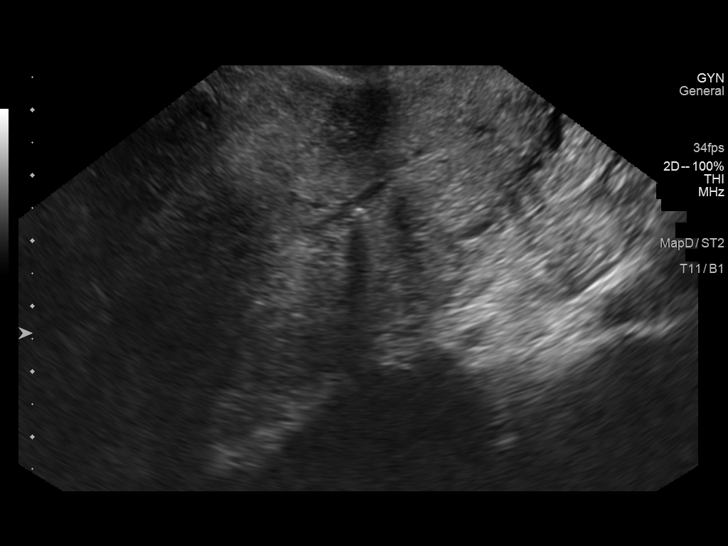
[im 82/123]
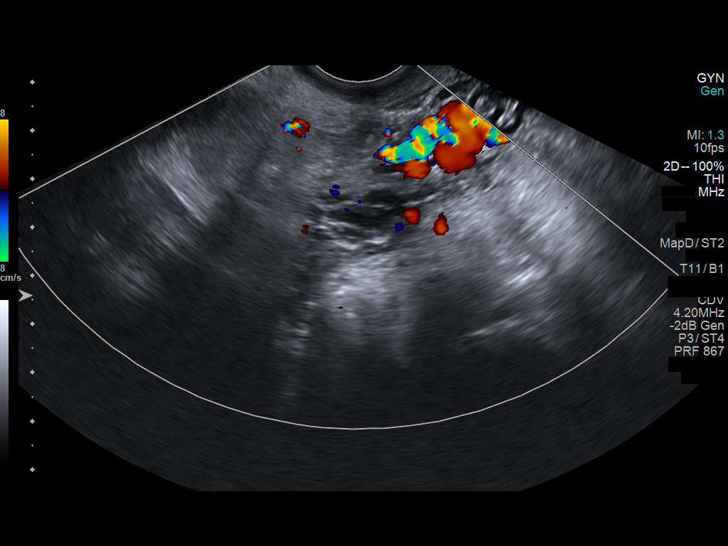
[im 92/123]
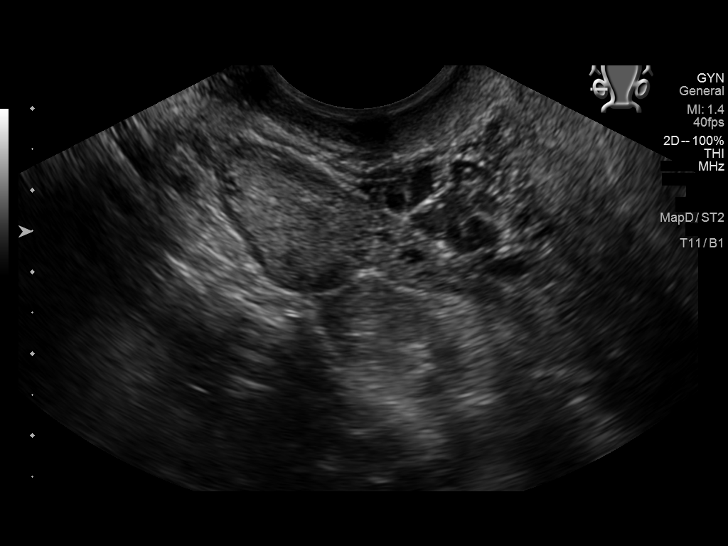
[im 102/123]
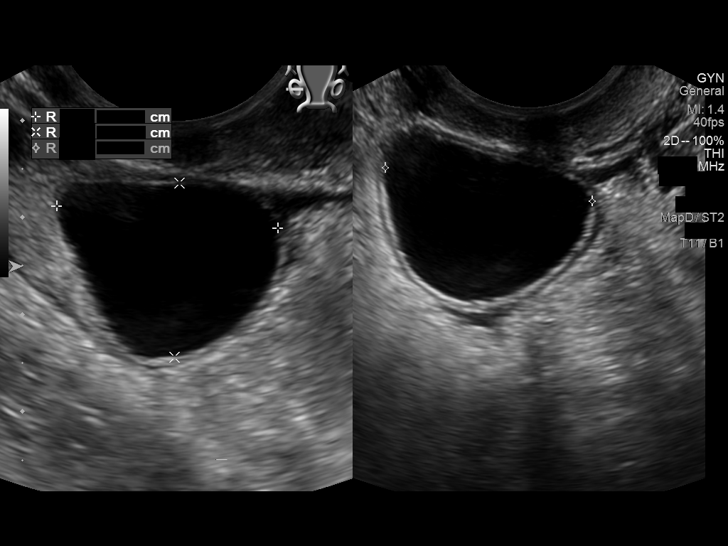
[im 112/123]
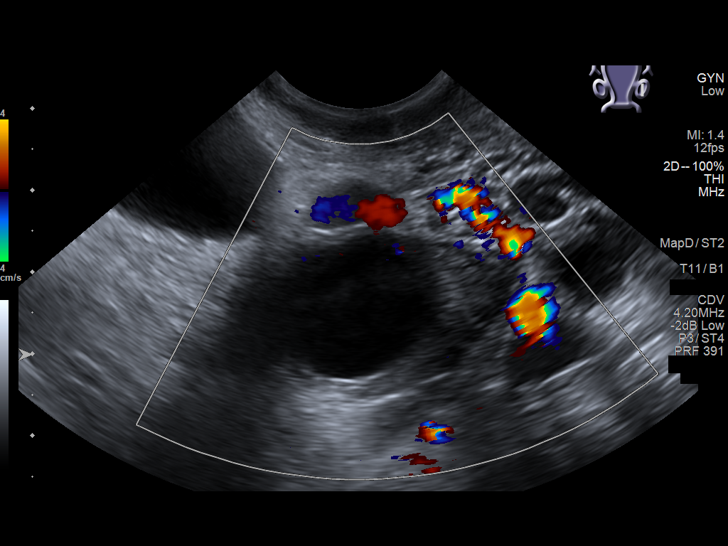
[im 123/123]
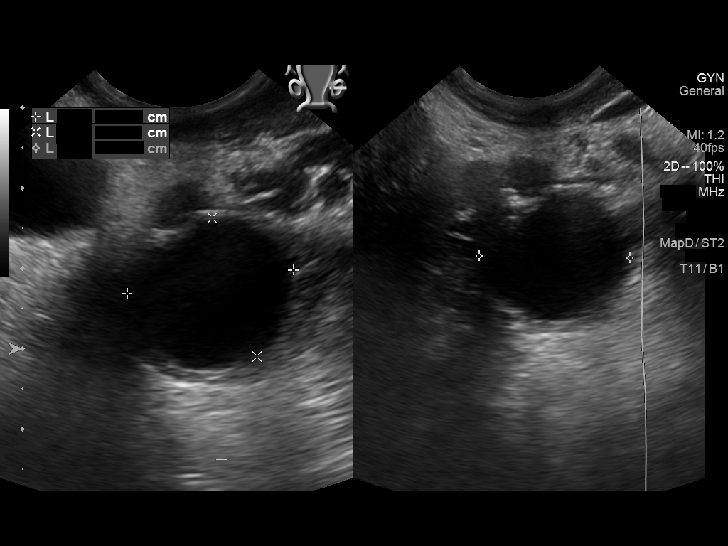

[13 of 25 positions shown; findings below may reference images not displayed]

FINDINGS: Uterus

Measurements: 10.9 x 8.3 x 7.6 cm = volume: 359 mL. There is a large
fibroid measuring approximately 7.3 x 6 x 7 cm involving the uterine
fundus and causing mass effect on the endometrium.

Endometrium

Thickness: 6 mm.  An IUD is noted

Right ovary

Measurements: 4 x 1.9 x 1.9 cm = volume: 7.5 mL. There is a simple
appearing cyst measuring 2.3 x 1.8 x 2.6 cm that is favored to
represent a dominant follicle.

Left ovary

Measurements: 3.1 x 2.2 x 2.3 cm = volume: 8.1 mL. There is a
dominant follicle measuring 2.1 x 1.8 x 2.2 cm

Other findings

No abnormal free fluid.
IMPRESSION: 1. IUD in place. Exact positioning is difficult to determine
secondary to a large fibroid.
2. Large fundal fibroid as detailed above.

## 2021-07-24 MED ORDER — ALPRAZOLAM 0.5 MG PO TABS: 0.2500 mg | ORAL_TABLET | Freq: Every day | ORAL | 0 refills | Status: DC

## 2021-07-24 MED ORDER — ESCITALOPRAM OXALATE 20 MG PO TABS: ORAL_TABLET | ORAL | 3 refills | Status: DC

## 2021-07-24 NOTE — Assessment & Plan Note (Signed)
Manages with lexapro 20 mg daily.  Reviewed last cmp 11/22 Takes 0.5 mg 1/2 tab xanax prn severe anxiety/insomnia, last fill 3/22 #30.  Will resend.

## 2021-09-30 ENCOUNTER — Ambulatory Visit (INDEPENDENT_AMBULATORY_CARE_PROVIDER_SITE_OTHER): Payer: BC Managed Care – PPO | Admitting: Family Medicine

## 2021-09-30 ENCOUNTER — Ambulatory Visit: Payer: Self-pay

## 2021-09-30 ENCOUNTER — Telehealth: Payer: Self-pay | Admitting: Physician Assistant

## 2021-09-30 ENCOUNTER — Encounter: Payer: Self-pay | Admitting: Family Medicine

## 2021-09-30 ENCOUNTER — Telehealth: Payer: Self-pay | Admitting: Family Medicine

## 2021-09-30 VITALS — BP 110/82 | HR 72 | Temp 98.6°F | Resp 16 | Wt 146.7 lb

## 2021-09-30 DIAGNOSIS — N390 Urinary tract infection, site not specified: Secondary | ICD-10-CM

## 2021-09-30 DIAGNOSIS — B3731 Acute candidiasis of vulva and vagina: Secondary | ICD-10-CM

## 2021-09-30 DIAGNOSIS — R300 Dysuria: Secondary | ICD-10-CM

## 2021-09-30 LAB — POCT URINALYSIS DIPSTICK
Bilirubin, UA: NEGATIVE
Glucose, UA: NEGATIVE
Ketones, UA: NEGATIVE
Nitrite, UA: NEGATIVE
Protein, UA: POSITIVE — AB
Spec Grav, UA: <=1.005 — AB (ref 1.010–1.025)
Urobilinogen, UA: 0.2 E.U./dL
pH, UA: 5 (ref 5.0–8.0)

## 2021-09-30 MED ORDER — FLUCONAZOLE 150 MG PO TABS
150.0000 mg | ORAL_TABLET | Freq: Once | ORAL | 1 refills | Status: AC
Start: 2021-09-30 — End: 2021-09-30

## 2021-09-30 MED ORDER — CEPHALEXIN 500 MG PO CAPS: 500.0000 mg | ORAL_CAPSULE | Freq: Two times a day (BID) | ORAL | 0 refills | Status: AC

## 2021-09-30 NOTE — Telephone Encounter (Signed)
That's fine, can dispense '200mg'$  tablet

## 2021-09-30 NOTE — Telephone Encounter (Signed)
  Chief Complaint: Pain with urination, also yeast infection s/s Symptoms: ibid Frequency: Last week for yeast infection, UTI over the weekend Pertinent Negatives: Patient denies fever Disposition: '[]'$ ED /'[]'$ Urgent Care (no appt availability in office) / '[x]'$ Appointment(In office/virtual)/ '[]'$  [facility]/ '[]'$ Home Care/ '[]'$ Refused Recommended Disposition /'[]'$ [facility]/ '[]'$  Follow-up with PCP Additional Notes: Pt reports yeast infection s/s starting last week. Pt tried Monistat with no relief. Pt now also has burning  with urination.   Summary: UTI Advice   Pt is calling to report UTI & Yeast infection sx with vaginal burning and frequent urination. No available appts today. Pt was scheduled for tomorrow. Pt has been take AZO. Wanting advice      Reason for Disposition  All other urine symptoms  Answer Assessment - Initial Assessment Questions 1. SYMPTOM: "What's the main symptom you're concerned about?" (e.g., frequency, incontinence)     Frequency, burning, Yeast infection 2. ONSET: "When did the  Yeast infection    start?"     Last week, UTI symptoms over the weekend 3. PAIN: "Is there any pain?" If Yes, ask: "How bad is it?" (Scale: 1-10; mild, moderate, severe)     Yes - With urination 4. CAUSE: "What do you think is causing the symptoms?" UTI/ yeast infection     5. OTHER SYMPTOMS: "Do you have any other symptoms?" (e.g., blood in urine, fever, flank pain, pain with urination)     Pain with urination, frequency 6. PREGNANCY: "Is there any chance you are pregnant?" "When was your last menstrual period?"     no  Protocols used: Urinary Symptoms-A-AH

## 2021-09-30 NOTE — Telephone Encounter (Signed)
Please see annotation.  They has 200 mg.

## 2021-09-30 NOTE — Telephone Encounter (Signed)
fluconazole (DIFLUCAN) 150 MG tablet is on back order at [facility], [address]  [place], [place] [address]  Phone:  [phone]  Fax:  [phone]   Pt stated that they advised if the provider can resend the Rx for '200MG'$  that they have that in stock but not '150MG'$  / please advise asap

## 2021-09-30 NOTE — Telephone Encounter (Signed)
Alisha Hamilton from [facility] stated they do not have medication fluconazole (DIFLUCAN) 150 MG tablet in stock and its back ordered.  Pharmacy asking if they can give pt fluconazole (DIFLUCAN) '200MG'$  tablet .  Pharmacy requesting a call back.   Please advise.

## 2021-09-30 NOTE — Progress Notes (Signed)
I,Jana Robinson,acting as a scribe for Lelon Huh, MD.,have documented all relevant documentation on the behalf of Lelon Huh, MD,as directed by  Lelon Huh, MD while in the presence of Lelon Huh, MD.   Established patient visit   Patient: Alisha Hamilton   DOB: [birth year]   46 y.o. Female  MRN: [id number] Visit Date: 09/30/2021  Today's healthcare provider: Lelon Huh, MD   Chief Complaint  Patient presents with   Dysuria   Vaginal Discharge   Subjective    HPI Urinary symptoms  She reports new onset. Reports pain with urination and frequency. The current episode started a few days ago and is staying constant. Patient states symptoms are moderate in intensity, occurring constantly. She  has not been recently treated for similar symptoms. Using AZO.   Associated symptoms: No abdominal pain No back pain  No chills No constipation   No cramping No diarrhea  No discharge No fever  No hematuria No nausea  No vomiting    ---------------------------------------------------------------------------------------  Also complains of possible yeast infection with vaginal itching and white discharge.  Tried monistat with no relief. Onset 1 week ago.     Medications: Outpatient Medications Prior to Visit  Medication Sig   ALPRAZolam (XANAX) 0.5 MG tablet Take 0.5-1 tablets (0.25-0.5 mg total) by mouth daily. As needed for anxiety   escitalopram (LEXAPRO) 20 MG tablet TAKE 1 TABLET(20 MG) BY MOUTH DAILY   Ferrous Sulfate (IRON PO) Take 1 tablet by mouth daily.   ibuprofen (ADVIL) 800 MG tablet Take 1 tablet (800 mg total) by mouth every 8 (eight) hours.   ondansetron (ZOFRAN ODT) 4 MG disintegrating tablet Take 1 tablet (4 mg total) by mouth every 8 (eight) hours as needed for nausea or vomiting.   No facility-administered medications prior to visit.     Review of Systems  Constitutional:  Negative for appetite change, chills, fatigue and fever.  Respiratory:   Negative for chest tightness and shortness of breath.   Cardiovascular:  Negative for chest pain and palpitations.  Gastrointestinal:  Negative for abdominal pain, nausea and vomiting.  Neurological:  Negative for dizziness and weakness.        Objective    BP 110/82 (BP Location: Right Arm, Patient Position: Sitting, Cuff Size: Normal)   Pulse 72   Temp 98.6 F (37 C) (Oral)   Resp 16   Wt 146 lb 11.2 oz (66.5 kg)   LMP 01/09/2021 Comment: IUD removed during surgical procedure 01/10/2021 with Dr. Marisue Brooklyn.  SpO2 100%   BMI 22.98 kg/m    Physical Exam  General appearance: Well developed, well nourished female, cooperative and in no acute distress Head: Normocephalic, without obvious abnormality, atraumatic Respiratory: Respirations even and unlabored, normal respiratory rate Extremities: All extremities are intact.  Skin: Skin color, texture, turgor normal. No rashes seen  Psych: Appropriate mood and affect. Neurologic: Mental status: Alert, oriented to person, place, and time, thought content appropriate.   Results for orders placed or performed in visit on 09/30/21  POCT Urinalysis Dipstick  Result Value Ref Range   Color, UA yellow    Clarity, UA     Glucose, UA Negative Negative   Bilirubin, UA neg    Ketones, UA neg    Spec Grav, UA <=1.005 (A) 1.010 - 1.025   Blood, UA small    pH, UA 5.0 5.0 - 8.0   Protein, UA Positive (A) Negative   Urobilinogen, UA 0.2 0.2 or 1.0 E.U./dL  Nitrite, UA neg    Leukocytes, UA Large (3+) (A) Negative   Appearance     Odor      Assessment & Plan     1. Dysuria   2. Urinary tract infection without hematuria, site unspecified  - Urine Culture - cephALEXin (KEFLEX) 500 MG capsule; Take 1 capsule (500 mg total) by mouth 2 (two) times daily for 5 days.  Dispense: 10 capsule; Refill: 0  3. Yeast vaginitis  - fluconazole (DIFLUCAN) 150 MG tablet; Take 1 tablet (150 mg total) by mouth once for 1 dose.  Dispense: 1 tablet;  Refill: 1      The entirety of the information documented in the History of Present Illness, Review of Systems and Physical Exam were personally obtained by me. Portions of this information were initially documented by the CMA and reviewed by me for thoroughness and accuracy.     Lelon Huh, MD  [facility] [phone] (phone) [phone] (fax)  [place]

## 2021-10-01 ENCOUNTER — Ambulatory Visit: Payer: BC Managed Care – PPO | Admitting: Family Medicine

## 2021-10-01 NOTE — Telephone Encounter (Signed)
Advised 

## 2021-10-03 ENCOUNTER — Encounter: Payer: Self-pay | Admitting: Family Medicine

## 2021-10-03 LAB — URINE CULTURE

## 2021-10-03 LAB — SPECIMEN STATUS REPORT

## 2022-01-23 ENCOUNTER — Encounter: Payer: BC Managed Care – PPO | Admitting: Physician Assistant

## 2022-02-07 ENCOUNTER — Encounter: Payer: Self-pay | Admitting: Physician Assistant

## 2022-02-07 ENCOUNTER — Ambulatory Visit (INDEPENDENT_AMBULATORY_CARE_PROVIDER_SITE_OTHER): Payer: BC Managed Care – PPO | Admitting: Physician Assistant

## 2022-02-07 VITALS — BP 115/85 | HR 74 | Ht 66.0 in | Wt 145.7 lb

## 2022-02-07 DIAGNOSIS — Z0000 Encounter for general adult medical examination without abnormal findings: Secondary | ICD-10-CM

## 2022-02-07 DIAGNOSIS — Z23 Encounter for immunization: Secondary | ICD-10-CM | POA: Diagnosis not present

## 2022-02-07 DIAGNOSIS — Z1159 Encounter for screening for other viral diseases: Secondary | ICD-10-CM

## 2022-02-07 DIAGNOSIS — F419 Anxiety disorder, unspecified: Secondary | ICD-10-CM

## 2022-02-07 MED ORDER — ESCITALOPRAM OXALATE 20 MG PO TABS: ORAL_TABLET | ORAL | 3 refills | Status: DC

## 2022-02-07 MED ORDER — ALPRAZOLAM 0.5 MG PO TABS: 0.2500 mg | ORAL_TABLET | Freq: Every day | ORAL | 0 refills | Status: DC

## 2022-02-07 NOTE — Assessment & Plan Note (Addendum)
Managed with lexapro 20 mg and 0.5 mg 1/2 tab xanax prn severe anxiety/insomnia Last fill 5/23 for #30 , refilled today

## 2022-02-07 NOTE — Progress Notes (Signed)
I,Sha'taria Tyson,acting as a Education administrator for [employer], PA-C.,have documented all relevant documentation on the behalf of Mikey Kirschner, PA-C,as directed by  Mikey Kirschner, PA-C while in the presence of Mikey Kirschner, PA-C.   Complete physical exam   Patient: Alisha Hamilton   DOB: 10/17/1975   46 y.o. Female  MRN: [id number] Visit Date: 02/07/2022  Today's healthcare provider: Mikey Kirschner, PA-C   Cc. cpe  Subjective    Alisha Hamilton is a 46 y.o. female who presents today for a complete physical exam.  She reports consuming a general diet. The patient does not participate in regular exercise at present. She generally feels well. She reports sleeping well. She does not have additional problems to discuss today.  HPI  Influenza: would like to receive today Covid: would like to receive today Hepatitis C Screening: yes Cologuard ordered 07/24/21. Still showing incomplete.    Past Medical History:  Diagnosis Date   Anxiety    Complication of anesthesia    felt like skin was crawling after D&C, needed benadryl   Menorrhagia with irregular cycle    Migraines    Night sweats    Uterine leiomyoma    Past Surgical History:  Procedure Laterality Date   DILATION AND CURETTAGE OF UTERUS  02/25/2003   LEEP  02/24/2009   OTHER SURGICAL HISTORY     clips put on nerves 2006 (facial)   ROBOTIC ASSISTED LAPAROSCOPIC HYSTERECTOMY AND SALPINGECTOMY Bilateral 01/10/2021   Procedure: XI ROBOTIC ASSISTED LAPAROSCOPIC HYSTERECTOMY AND SALPINGECTOMY; REMOVAL OF IUD; CYSTOSCOPY;  Surgeon: Will Bonnet, MD;  Location: [facility];  Service: Gynecology;  Laterality: Bilateral;   Social History   Socioeconomic History   Marital status: Divorced    Spouse name: Not on file   Number of children: 2   Years of education: Not on file   Highest education level: Not on file  Occupational History   Not on file  Tobacco Use   Smoking status: Never   Smokeless tobacco: Never  Vaping Use    Vaping Use: Never used  Substance and Sexual Activity   Alcohol use: Yes    Comment: occassion   Drug use: No   Sexual activity: Yes    Birth control/protection: None  Other Topics Concern   Not on file  Social History Narrative   Not on file   Social Determinants of Health   Financial Resource Strain: Not on file  Food Insecurity: Not on file  Transportation Needs: Not on file  Physical Activity: Not on file  Stress: Not on file  Social Connections: Not on file  Intimate Partner Violence: Not on file   Family Status  Relation Name Status   Mother  Alive   Father  Alive   Sister  Alive   Brother  Alive   Daughter  Alive   Daughter  Alive   Neg Hx  (Not Specified)   Family History  Problem Relation Age of Onset   Diabetes Father    Anxiety disorder Sister    Irritable bowel syndrome Sister    Alcohol abuse Brother    Hypertension Brother    Hyperlipidemia Brother    Anxiety disorder Brother    Breast cancer Neg Hx    No Known Allergies  Patient Care Team: Mikey Kirschner, PA-C as PCP - General (Physician Assistant)   Medications: Outpatient Medications Prior to Visit  Medication Sig   Ferrous Sulfate (IRON PO) Take 1 tablet by mouth daily.   [  DISCONTINUED] ALPRAZolam (XANAX) 0.5 MG tablet Take 0.5-1 tablets (0.25-0.5 mg total) by mouth daily. As needed for anxiety   [DISCONTINUED] escitalopram (LEXAPRO) 20 MG tablet TAKE 1 TABLET(20 MG) BY MOUTH DAILY   ibuprofen (ADVIL) 800 MG tablet Take 1 tablet (800 mg total) by mouth every 8 (eight) hours. (Patient not taking: Reported on 02/07/2022)   ondansetron (ZOFRAN ODT) 4 MG disintegrating tablet Take 1 tablet (4 mg total) by mouth every 8 (eight) hours as needed for nausea or vomiting. (Patient not taking: Reported on 02/07/2022)   No facility-administered medications prior to visit.    Review of Systems  Constitutional:  Negative for fatigue and fever.  Respiratory:  Negative for cough and shortness of breath.    Cardiovascular:  Negative for chest pain and leg swelling.  Gastrointestinal:  Negative for abdominal pain.  Neurological:  Negative for dizziness and headaches.  Psychiatric/Behavioral:  The patient is nervous/anxious.      Objective    BP 115/85 (BP Location: Left Arm, Patient Position: Sitting, Cuff Size: Normal)   Pulse 74   Ht '5\' 6"'$  (1.676 m)   Wt 145 lb 11.2 oz (66.1 kg)   LMP 01/09/2021 Comment: IUD removed during surgical procedure 01/10/2021 with Dr. Marisue Brooklyn.  SpO2 100%   BMI 23.52 kg/m  Blood pressure 115/85, pulse 74, Schrier '5\' 6"'$  (1.676 m), weight 145 lb 11.2 oz (66.1 kg), last menstrual period 01/09/2021, SpO2 100 %.   Physical Exam Constitutional:      General: She is awake.     Appearance: She is well-developed. She is not ill-appearing.  HENT:     Head: Normocephalic.     Right Ear: Tympanic membrane normal.     Left Ear: Tympanic membrane normal.     Nose: Nose normal. No congestion or rhinorrhea.     Mouth/Throat:     Pharynx: No oropharyngeal exudate or posterior oropharyngeal erythema.  Eyes:     Conjunctiva/sclera: Conjunctivae normal.     Pupils: Pupils are equal, round, and reactive to light.  Neck:     Thyroid: No thyroid mass or thyromegaly.  Cardiovascular:     Rate and Rhythm: Normal rate and regular rhythm.     Heart sounds: Normal heart sounds.  Pulmonary:     Effort: Pulmonary effort is normal.     Breath sounds: Normal breath sounds.  Abdominal:     Palpations: Abdomen is soft.     Tenderness: There is no abdominal tenderness.  Musculoskeletal:     Right lower leg: No swelling. No edema.     Left lower leg: No swelling. No edema.  Lymphadenopathy:     Cervical: No cervical adenopathy.  Skin:    General: Skin is warm.  Neurological:     Mental Status: She is alert and oriented to person, place, and time.  Psychiatric:        Attention and Perception: Attention normal.        Mood and Affect: Mood normal.        Speech: Speech  normal.        Behavior: Behavior normal. Behavior is cooperative.     Last depression screening scores    07/24/2021    4:14 PM 06/01/2019    2:00 PM  PHQ 2/9 Scores  PHQ - 2 Score 0 0  PHQ- 9 Score 0 1   Last fall risk screening    07/24/2021    4:15 PM  Fall Risk   Falls in the past year?  0  Number falls in past yr: 0  Injury with Fall? 0  Risk for fall due to : No Fall Risks   Last Audit-C alcohol use screening    07/24/2021    4:15 PM  Alcohol Use Disorder Test (AUDIT)  1. How often do you have a drink containing alcohol? 2  2. How many drinks containing alcohol do you have on a typical day when you are drinking? 1  3. How often do you have six or more drinks on one occasion? 1  AUDIT-C Score 4   A score of 3 or more in women, and 4 or more in men indicates increased risk for alcohol abuse, EXCEPT if all of the points are from question 1   No results found for any visits on 02/07/22.  Assessment & Plan    Routine Health Maintenance and Physical Exam  Exercise Activities and Dietary recommendations --balanced diet high in fiber and protein, low in sugars, carbs, fats. --physical activity/exercise 30 minutes 3-5 times a week     Immunization History  Administered Date(s) Administered   Influenza Inj Mdck Quad Pf 12/16/2017   Influenza,inj,Quad PF,6+ Mos 12/08/2016, 02/07/2022   Influenza-Unspecified 12/14/2019   Moderna Sars-Covid-2 Vaccination 04/23/2019, 05/21/2019   Pfizer Covid-19 Vaccine Bivalent Booster 1yr & up 02/07/2022   Td 07/24/2021   Tdap 05/16/2010    Health Maintenance  Topic Date Due   Hepatitis C Screening  Never done   MAMMOGRAM  10/22/2019   Fecal DNA (Cologuard)  Never done   COVID-19 Vaccine (4 - 2023-24 season) 04/04/2022   PAP SMEAR-Modifier  04/19/2022   DTaP/Tdap/Td (3 - Td or Tdap) 07/25/2031   INFLUENZA VACCINE  Completed   HIV Screening  Completed   HPV VACCINES  Aged Out    Discussed health benefits of physical  activity, and encouraged her to engage in regular exercise appropriate for her age and condition. Pap Smear: Due 04/19/22 Tetanus: Due 07/25/31 Reminded cologuard, reminded mammogram.  Problem List Items Addressed This Visit       Other   Anxiety    Managed with lexapro 20 mg and 0.5 mg 1/2 tab xanax prn severe anxiety/insomnia Last fill 5/23 for #30 , refilled today      Relevant Medications   escitalopram (LEXAPRO) 20 MG tablet   ALPRAZolam (XANAX) 0.5 MG tablet   Other Visit Diagnoses     Annual physical exam    -  Primary   Relevant Orders   CBC with Differential/Platelet   Comprehensive metabolic panel   Lipid panel   TSH   HgB A1c   Encounter for hepatitis C screening test for low risk patient       Relevant Orders   Hepatitis C antibody   Need for immunization against influenza       Relevant Orders   Flu Vaccine QUAD 628moM (Fluarix, Fluzone & Alfiuria Quad PF) (Completed)   Need for COVID-19 vaccine       Relevant Orders   PfPension scheme managerCompleted)       Return in about 6 months (around 08/09/2022) for anxiety.    I, LiMikey KirschnerPA-C have reviewed all documentation for this visit. The documentation on  02/07/2022 for the exam, diagnosis, procedures, and orders are all accurate and complete.  LiMikey KirschnerPA-C [facility] BuHetlandNC[place]ffice: [phone]: [phone]

## 2022-02-07 NOTE — Patient Instructions (Signed)
Please review the attached list of medications and notify my office if there are any errors.   Please bring all of your medications to every appointment so we can make sure that our medication list is the same as yours.  Please go to the lab draw station in [address] on the second floor of [facility] when you are fasting for 8 hours. Normal hours are 8:00 am to 11:30 am and 1:00 pm to 4:00 pm Monday through Friday.   Please contact [phone] to schedule your mammogram. You will be asked your location preference to have procedure performed. You have two options listed below.  1) [facility] located at [place], [place] [address] 2) [facility] located at [address] [place], [place] [address]  Upon results being received our office will contact you. As well as all results can be viewed through your MyChart. Please feel free to contact us if you have any further questions or concerns.

## 2022-02-15 LAB — LIPID PANEL
Chol/HDL Ratio: 2.7 ratio (ref 0.0–4.4)
Cholesterol, Total: 148 mg/dL (ref 100–199)
HDL: 54 mg/dL (ref >39)
LDL Chol Calc (NIH): 81 mg/dL (ref 0–99)
Triglycerides: 64 mg/dL (ref 0–149)
VLDL Cholesterol Cal: 13 mg/dL (ref 5–40)

## 2022-02-15 LAB — CBC WITH DIFFERENTIAL/PLATELET
Basophils Absolute: 0 10*3/uL (ref 0.0–0.2)
Basos: 1 %
EOS (ABSOLUTE): 0.1 10*3/uL (ref 0.0–0.4)
Eos: 1 %
Hematocrit: 40.9 % (ref 34.0–46.6)
Hemoglobin: 13 g/dL (ref 11.1–15.9)
Immature Grans (Abs): 0 10*3/uL (ref 0.0–0.1)
Immature Granulocytes: 0 %
Lymphocytes Absolute: 1.7 10*3/uL (ref 0.7–3.1)
Lymphs: 31 %
MCH: 28.6 pg (ref 26.6–33.0)
MCHC: 31.8 g/dL (ref 31.5–35.7)
MCV: 90 fL (ref 79–97)
Monocytes Absolute: 0.3 10*3/uL (ref 0.1–0.9)
Monocytes: 6 %
Neutrophils Absolute: 3.2 10*3/uL (ref 1.4–7.0)
Neutrophils: 61 %
Platelets: 225 10*3/uL (ref 150–450)
RBC: 4.54 x10E6/uL (ref 3.77–5.28)
RDW: 11.9 % (ref 11.7–15.4)
WBC: 5.3 10*3/uL (ref 3.4–10.8)

## 2022-02-15 LAB — HEPATITIS C ANTIBODY: Hep C Virus Ab: NONREACTIVE

## 2022-02-15 LAB — COMPREHENSIVE METABOLIC PANEL
ALT: 9 IU/L (ref 0–32)
AST: 12 IU/L (ref 0–40)
Albumin/Globulin Ratio: 2.1 (ref 1.2–2.2)
Albumin: 4.4 g/dL (ref 3.9–4.9)
Alkaline Phosphatase: 63 IU/L (ref 44–121)
BUN/Creatinine Ratio: 15 (ref 9–23)
BUN: 11 mg/dL (ref 6–24)
Bilirubin Total: 1.6 mg/dL — ABNORMAL HIGH (ref 0.0–1.2)
CO2: 22 mmol/L (ref 20–29)
Calcium: 8.7 mg/dL (ref 8.7–10.2)
Chloride: 104 mmol/L (ref 96–106)
Creatinine, Ser: 0.72 mg/dL (ref 0.57–1.00)
Globulin, Total: 2.1 g/dL (ref 1.5–4.5)
Glucose: 76 mg/dL (ref 70–99)
Potassium: 4.2 mmol/L (ref 3.5–5.2)
Sodium: 140 mmol/L (ref 134–144)
Total Protein: 6.5 g/dL (ref 6.0–8.5)
eGFR: 105 mL/min/{1.73_m2} (ref >59)

## 2022-02-15 LAB — HEMOGLOBIN A1C
Est. average glucose Bld gHb Est-mCnc: 103 mg/dL
Hgb A1c MFr Bld: 5.2 % (ref 4.8–5.6)

## 2022-02-15 LAB — TSH: TSH: 1.21 u[IU]/mL (ref 0.450–4.500)

## 2022-02-18 NOTE — Progress Notes (Signed)
Labs have been reviewed and are normal and/or stable.  Please let us know if you have any questions.  Thank you, Gwyneth Sprout, [address] [place], [place] [address] [phone] (phone) [phone] (fax) [place]

## 2022-03-02 LAB — COLOGUARD: COLOGUARD: NEGATIVE

## 2022-06-23 ENCOUNTER — Telehealth: Payer: Self-pay | Admitting: Physician Assistant

## 2022-06-23 DIAGNOSIS — F419 Anxiety disorder, unspecified: Secondary | ICD-10-CM

## 2022-06-23 NOTE — Telephone Encounter (Signed)
[facility] faxed refill request for the following medications:   escitalopram (LEXAPRO) 20 MG tablet    Please advise.

## 2022-06-23 NOTE — Telephone Encounter (Signed)
LRF 02/07/22 #90 3RF Patient requesting refill too soon

## 2022-07-22 ENCOUNTER — Encounter: Payer: Self-pay | Admitting: Physician Assistant

## 2022-07-23 ENCOUNTER — Encounter: Payer: Self-pay | Admitting: Physician Assistant

## 2022-07-23 ENCOUNTER — Ambulatory Visit: Payer: BC Managed Care – PPO | Admitting: Physician Assistant

## 2022-07-23 VITALS — BP 127/88 | HR 80 | Ht 66.0 in | Wt 146.1 lb

## 2022-07-23 DIAGNOSIS — N6321 Unspecified lump in the left breast, upper outer quadrant: Secondary | ICD-10-CM

## 2022-07-23 NOTE — Progress Notes (Signed)
      Established patient visit   Patient: Alisha Hamilton   DOB: 1975-03-15   47 y.o. Female  MRN: [id number] Visit Date: 07/23/2022  Today's healthcare provider: Alfredia Ferguson, PA-C   Cc. Left breast lump  Subjective    HPI  Patient is being seen due to lump in left breast X few days (when noticed). Patient reports no changes in size since noticing, no redness, pain or dimpling associated. Patient has never noticed a lump before. Patient found while doing a self breast exam.   Medications: Outpatient Medications Prior to Visit  Medication Sig   ALPRAZolam (XANAX) 0.5 MG tablet Take 0.5-1 tablets (0.25-0.5 mg total) by mouth daily. As needed for anxiety   escitalopram (LEXAPRO) 20 MG tablet TAKE 1 TABLET(20 MG) BY MOUTH DAILY   Ferrous Sulfate (IRON PO) Take 1 tablet by mouth daily.   ibuprofen (ADVIL) 800 MG tablet Take 1 tablet (800 mg total) by mouth every 8 (eight) hours.   ondansetron (ZOFRAN ODT) 4 MG disintegrating tablet Take 1 tablet (4 mg total) by mouth every 8 (eight) hours as needed for nausea or vomiting. (Patient not taking: Reported on 02/07/2022)   No facility-administered medications prior to visit.    Review of Systems  Constitutional:  Negative for fatigue and fever.  Respiratory:  Negative for cough and shortness of breath.   Cardiovascular:  Negative for chest pain and leg swelling.  Gastrointestinal:  Negative for abdominal pain.  Neurological:  Negative for dizziness and headaches.      Objective    Blood pressure 127/88, pulse 80, Cesaro 5\' 6"  (1.676 m), weight 146 lb 1.6 oz (66.3 kg), last menstrual period 01/09/2021, SpO2 98 %.   Physical Exam Chest:       Comments: L breast with a 2-3 cm mobile firm mass at 3:00 2-3 CMFN.  Otherwise no skin changes, nipple discharge, nipple inversion.  Right breast with no abnormalities.     No results found for any visits on 07/23/22.  Assessment & Plan     1. Mass of upper outer quadrant of left  breast Ordered dx mammo sono  Pt has hx of cysts  - MM 3D DIAGNOSTIC MAMMOGRAM BILATERAL BREAST; Future - [place] LIMITED ULTRASOUND INCLUDING AXILLA LEFT BREAST ; Future  Return if symptoms worsen or fail to improve.      I, Alfredia Ferguson, PA-C have reviewed all documentation for this visit. The documentation on  07/23/22   for the exam, diagnosis, procedures, and orders are all accurate and complete.  Alfredia Ferguson, PA-C [facility] [address] [place], [place], [address] Office: [phone] Fax: [phone]   [facility]

## 2022-07-29 ENCOUNTER — Other Ambulatory Visit: Payer: BC Managed Care – PPO

## 2022-08-06 ENCOUNTER — Ambulatory Visit
Admission: RE | Admit: 2022-08-06 | Discharge: 2022-08-06 | Disposition: A | Discharge status: Home / Self Care | Payer: BC Managed Care – PPO | Source: Ambulatory Visit | Attending: Physician Assistant | Admitting: Physician Assistant

## 2022-08-06 DIAGNOSIS — N6321 Unspecified lump in the left breast, upper outer quadrant: Secondary | ICD-10-CM | POA: Insufficient documentation

## 2022-08-15 ENCOUNTER — Encounter: Payer: Self-pay | Admitting: Physician Assistant

## 2022-08-15 ENCOUNTER — Ambulatory Visit: Payer: BC Managed Care – PPO | Admitting: Physician Assistant

## 2022-08-15 DIAGNOSIS — F419 Anxiety disorder, unspecified: Secondary | ICD-10-CM | POA: Diagnosis not present

## 2022-08-15 NOTE — Progress Notes (Signed)
I,Vanessa  Vital,acting as a Neurosurgeon for [employer], PA-C.,have documented all relevant documentation on the behalf of Alfredia Ferguson, PA-C,as directed by  Alfredia Ferguson, PA-C while in the presence of Alfredia Ferguson, PA-C.  Established patient visit   Patient: Alisha Hamilton   DOB: 1975/04/29   47 y.o. Female  MRN: [id number] Visit Date: 08/15/2022  Today's healthcare provider: Alfredia Ferguson, PA-C   Cc. Anxiety f/u  Subjective    HPI  Anxiety, Follow-up  She was last seen for anxiety 6 months ago. Changes made at last visit include no changes.   She reports excellent compliance with treatment. She reports excellent tolerance of treatment. She is not having side effects.   She feels her anxiety is mild and Improved since last visit.  Symptoms: No chest pain No difficulty concentrating  No dizziness No fatigue  No feelings of losing control No insomnia  No irritable No palpitations  No panic attacks No racing thoughts  No shortness of breath No sweating  No tremors/shakes    GAD-7 Results    02/07/2022    3:49 PM 07/24/2021    4:14 PM 04/26/2020    4:11 PM  GAD-7 Generalized Anxiety Disorder Screening Tool  1. Feeling Nervous, Anxious, or on Edge 0 0 0  2. Not Being Able to Stop or Control Worrying 0 0 0  3. Worrying Too Much About Different Things 0 0 0  4. Trouble Relaxing 0 1 0  5. Being So Restless it's Hard To Sit Still 0 0 0  6. Becoming Easily Annoyed or Irritable 0 0 0  7. Feeling Afraid As If Something Awful Might Happen 0 0 0  Total GAD-7 Score 0 1 0  Difficulty At Work, Home, or Getting  Along With Others? Not difficult at all Not difficult at all Not difficult at all    PHQ-9 Scores    08/15/2022    3:47 PM 07/23/2022    4:05 PM 07/24/2021    4:14 PM  PHQ9 SCORE ONLY  PHQ-9 Total Score 0 0 0    ---------------------------------------------------------------------------------------------------   Medications: Outpatient Medications Prior to  Visit  Medication Sig   ALPRAZolam (XANAX) 0.5 MG tablet Take 0.5-1 tablets (0.25-0.5 mg total) by mouth daily. As needed for anxiety   escitalopram (LEXAPRO) 20 MG tablet TAKE 1 TABLET(20 MG) BY MOUTH DAILY   Ferrous Sulfate (IRON PO) Take 1 tablet by mouth daily.   ibuprofen (ADVIL) 800 MG tablet Take 1 tablet (800 mg total) by mouth every 8 (eight) hours.   ondansetron (ZOFRAN ODT) 4 MG disintegrating tablet Take 1 tablet (4 mg total) by mouth every 8 (eight) hours as needed for nausea or vomiting. (Patient not taking: Reported on 02/07/2022)   No facility-administered medications prior to visit.    Review of Systems  Constitutional:  Negative for fatigue and fever.  Respiratory:  Negative for cough and shortness of breath.   Cardiovascular:  Negative for chest pain and leg swelling.  Gastrointestinal:  Negative for abdominal pain.  Neurological:  Negative for dizziness and headaches.      Objective    Blood pressure 113/81, pulse 81, temperature 98.3 F (36.8 C), temperature source Oral, resp. rate 14, weight 146 lb 6.4 oz (66.4 kg), last menstrual period 01/09/2021, SpO2 99 %.   Physical Exam Vitals reviewed.  Constitutional:      Appearance: She is not ill-appearing.  HENT:     Head: Normocephalic.  Eyes:     Conjunctiva/sclera: Conjunctivae  normal.  Cardiovascular:     Rate and Rhythm: Normal rate.  Pulmonary:     Effort: Pulmonary effort is normal. No respiratory distress.  Neurological:     General: No focal deficit present.     Mental Status: She is alert and oriented to person, place, and time.  Psychiatric:        Mood and Affect: Mood normal.        Behavior: Behavior normal.     No results found for any visits on 08/15/22.  Assessment & Plan     Problem List Items Addressed This Visit       Other   Anxiety    Stable Controlled with lexapro 20 mg and 0.5 mg 1/2 tab xanax prn severe anxiety/insomnia Last fill 02/07/22 does not need more  Ok for pt  to follow annually        Return in about 1 year (around 08/15/2023) for CPE.      I, Alfredia Ferguson, PA-C have reviewed all documentation for this visit. The documentation on  08/15/22   for the exam, diagnosis, procedures, and orders are all accurate and complete.  Alfredia Ferguson, PA-C [facility] [address] [place], [place], [address] Office: [phone] Fax: [phone]   [facility]

## 2022-08-15 NOTE — Assessment & Plan Note (Signed)
Stable Controlled with lexapro 20 mg and 0.5 mg 1/2 tab xanax prn severe anxiety/insomnia Last fill 02/07/22 does not need more  Ok for pt to follow annually

## 2022-12-21 ENCOUNTER — Other Ambulatory Visit: Payer: Self-pay | Admitting: Physician Assistant

## 2022-12-21 DIAGNOSIS — F419 Anxiety disorder, unspecified: Secondary | ICD-10-CM

## 2023-03-17 ENCOUNTER — Other Ambulatory Visit: Payer: Self-pay | Admitting: Family Medicine

## 2023-03-17 ENCOUNTER — Other Ambulatory Visit: Payer: Self-pay | Admitting: Physician Assistant

## 2023-03-17 DIAGNOSIS — F419 Anxiety disorder, unspecified: Secondary | ICD-10-CM

## 2023-03-17 NOTE — Telephone Encounter (Signed)
Requested medications are due for refill today.  yes  Requested medications are on the active medications list.  yes  Last refill. 02/07/2022 #90 3 rf  Future visit scheduled.   yes  Notes to clinic.  Please review for refill. Pt's upcoming appt is 3 months away.    Requested Prescriptions  Pending Prescriptions Disp Refills   escitalopram (LEXAPRO) 20 MG tablet 90 tablet 3    Sig: TAKE 1 TABLET(20 MG) BY MOUTH DAILY     Psychiatry:  Antidepressants - SSRI Failed - 03/17/2023  2:53 PM      Failed - Valid encounter within last 6 months    Recent Outpatient Visits           7 months ago Anxiety   [facility] Alfredia Ferguson, PA-C   7 months ago Mass of upper outer quadrant of left breast   [facility] Alfredia Ferguson, PA-C   1 year ago Annual physical exam   [facility] Alfredia Ferguson, PA-C   1 year ago Dysuria   [facility] Malva Limes, MD   1 year ago Anxiety   [facility] Alfredia Ferguson, PA-C       Future Appointments             In 4 months Simmons-Robinson, Tawanna Cooler, MD [facility]

## 2023-03-17 NOTE — Telephone Encounter (Unsigned)
Copied from CRM [id number]. Topic: General - Other >> Mar 17, 2023 11:34 AM Everette C wrote: Reason for CRM: Medication Refill - Most Recent Primary Care Visit:  Provider: Alfredia Ferguson Department: [facility] Visit Type: OFFICE VISIT Date: 08/15/2022  Medication: escitalopram (LEXAPRO) 20 MG tablet [[id number]]  Has the patient contacted their pharmacy? Yes (Agent: If no, request that the patient contact the pharmacy for the refill. If patient does not wish to contact the pharmacy document the reason why and proceed with request.) (Agent: If yes, when and what did the pharmacy advise?)  Is this the correct pharmacy for this prescription? Yes If no, delete pharmacy and type the correct one.  This is the patient's preferred pharmacy:  [id number] [place] - [address] AT [facility] [address] [place] [place] [address] Phone: [phone] Fax: [phone]   Has the prescription been filled recently? No  Is the patient out of the medication? Yes  Has the patient been seen for an appointment in the last year OR does the patient have an upcoming appointment? Yes  Can we respond through MyChart? No  Agent: Please be advised that Rx refills may take up to 3 business days. We ask that you follow-up with your pharmacy.

## 2023-03-18 MED ORDER — ESCITALOPRAM OXALATE 20 MG PO TABS: ORAL_TABLET | ORAL | 0 refills | Status: DC

## 2023-04-23 ENCOUNTER — Other Ambulatory Visit: Payer: Self-pay | Admitting: Family Medicine

## 2023-04-23 DIAGNOSIS — F419 Anxiety disorder, unspecified: Secondary | ICD-10-CM

## 2023-04-24 NOTE — Telephone Encounter (Signed)
 Requested medications are due for refill today.  yes  Requested medications are on the active medications list.  yes  Last refill. 03/18/2023 #30 0 rf  Future visit scheduled.   Yes in 3 months  Notes to clinic.  P already given a courtesy refill. Pt has scheduled an appt for 3 months from now. Please advise.    Requested Prescriptions  Pending Prescriptions Disp Refills   escitalopram (LEXAPRO) 20 MG tablet [Pharmacy Med Name: ESCITALOPRAM 20MG  TABLETS] 30 tablet 0    Sig: TAKE 1 TABLET BY MOUTH DAILY     Psychiatry:  Antidepressants - SSRI Failed - 04/24/2023  3:43 PM      Failed - Valid encounter within last 6 months    Recent Outpatient Visits           8 months ago Anxiety   [facility] Alfredia Ferguson, PA-C   9 months ago Mass of upper outer quadrant of left breast   [facility] Alfredia Ferguson, PA-C   1 year ago Annual physical exam   [facility] Alfredia Ferguson, PA-C   1 year ago Dysuria   [facility] Malva Limes, MD   1 year ago Anxiety   [facility] Alfredia Ferguson, PA-C       Future Appointments             In 3 months Simmons-Robinson, Tawanna Cooler, MD [facility]

## 2023-04-27 ENCOUNTER — Encounter: Payer: Self-pay | Admitting: Family Medicine

## 2023-04-27 ENCOUNTER — Telehealth: Payer: Self-pay | Admitting: Family Medicine

## 2023-04-27 DIAGNOSIS — F419 Anxiety disorder, unspecified: Secondary | ICD-10-CM | POA: Diagnosis not present

## 2023-04-27 NOTE — Assessment & Plan Note (Signed)
  Generalized Anxiety Disorder Chronic anxiety, well-controlled with Lexapro 20 mg daily for 12 years. Xanax 0.25 mg is used infrequently, approximately once every three months, primarily around her menstrual cycle. She reports no concerns with current management and prefers telemedicine follow-up for six-month check-ins due to the controlled nature of Xanax use. Chronic, stable  - Prescribe Lexapro 20 mg daily. - Prescribe Xanax 0.25 mg as needed for anxiety. - Schedule telemedicine follow-up in six months to monitor anxiety medication use.

## 2023-04-27 NOTE — Progress Notes (Signed)
 MyChart Video Visit    Virtual Visit via Video Note   This format is felt to be most appropriate for this patient at this time. Physical exam was limited by quality of the video and audio technology used for the visit.   Patient location: Patient's home address   Provider location: [facility]  [address]  [place], [place] [address]   I discussed the limitations of evaluation and management by telemedicine and the availability of in person appointments. The patient expressed understanding and agreed to proceed.  Patient: Alisha Hamilton   DOB: 08-01-1975   48 y.o. Female  MRN: [id number] Visit Date: 04/27/2023  Today's healthcare provider: Ronnald Ramp, MD   No chief complaint on file.  Subjective    HPI   Discussed the use of AI scribe software for clinical note transcription with the patient, who gave verbal consent to proceed.  History of Present Illness   Alisha Hamilton is a 48 year old female who presents for a medication refill.  She is currently out of Lexapro, which prompted this emergency appointment. She was previously seeing a provider who required visits every six months, but had agreed to annual visits starting in July. However, her medication ran out at the six-month mark, leading to this consultation.  Her anxiety is well-controlled with her current medication regimen. She has been taking Lexapro 20 mg daily for approximately twelve years and uses Xanax 0.25 mg as needed, primarily around her menstrual cycle when she experiences difficulty sleeping. She typically refills Xanax once every other year, indicating infrequent use. She describes Xanax as a 'security blanket' and notes that she takes it once every three months on average.  Her past medical history includes aura migraines and dysmenorrhea. She does not report any current issues with these conditions during this visit. She takes ibuprofen as needed and iron daily  in the form of ferrous sulfate.  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       Past Medical History:  Diagnosis Date   Anxiety    Complication of anesthesia    felt like skin was crawling after D&C, needed benadryl   Menorrhagia with irregular cycle    Migraines    Night sweats    Uterine leiomyoma     Medications: Outpatient Medications Prior to Visit  Medication Sig   ALPRAZolam (XANAX) 0.5 MG tablet TAKE 1/2 TO 1 TABLET(0.25 TO 0.5 MG) BY MOUTH DAILY AS NEEDED FOR ANXIETY   Ferrous Sulfate (IRON PO) Take 1 tablet by mouth daily.   ibuprofen (ADVIL) 800 MG tablet Take 1 tablet (800 mg total) by mouth every 8 (eight) hours.   [DISCONTINUED] escitalopram (LEXAPRO) 20 MG tablet TAKE 1 TABLET(20 MG) BY MOUTH DAILYTAKE 1 TABLET(20 MG) BY MOUTH DAILY   [DISCONTINUED] ondansetron (ZOFRAN ODT) 4 MG disintegrating tablet Take 1 tablet (4 mg total) by mouth every 8 (eight) hours as needed for nausea or vomiting. (Patient not taking: Reported on 02/07/2022)   No facility-administered medications prior to visit.    Review of Systems  Last CBC Lab Results  Component Value Date   WBC 5.3 02/14/2022   HGB 13.0 02/14/2022   HCT 40.9 02/14/2022   MCV 90 02/14/2022   MCH 28.6 02/14/2022   RDW 11.9 02/14/2022   PLT 225 02/14/2022   Last metabolic panel Lab Results  Component Value Date   GLUCOSE 76 02/14/2022   NA 140  02/14/2022   K 4.2 02/14/2022   CL 104 02/14/2022   CO2 22 02/14/2022   BUN 11 02/14/2022   CREATININE 0.72 02/14/2022   EGFR 105 02/14/2022   CALCIUM 8.7 02/14/2022   PROT 6.5 02/14/2022   ALBUMIN 4.4 02/14/2022   LABGLOB 2.1 02/14/2022   AGRATIO 2.1 02/14/2022   BILITOT 1.6 (H) 02/14/2022   ALKPHOS 63 02/14/2022   AST 12 02/14/2022   ALT 9 02/14/2022   ANIONGAP 4 (L) 01/04/2021   Last lipids Lab Results  Component Value Date   CHOL 148 02/14/2022   HDL 54 02/14/2022   LDLCALC 81 02/14/2022   TRIG  64 02/14/2022   CHOLHDL 2.7 02/14/2022   Last hemoglobin A1c Lab Results  Component Value Date   HGBA1C 5.2 02/14/2022   Last thyroid functions Lab Results  Component Value Date   TSH 1.210 02/14/2022        Objective    LMP 01/09/2021 Comment: IUD removed during surgical procedure 01/10/2021 with Dr. Edison Pace.  BP Readings from Last 3 Encounters:  08/15/22 113/81  07/23/22 127/88  02/07/22 115/85   Wt Readings from Last 3 Encounters:  08/15/22 146 lb 6.4 oz (66.4 kg)  07/23/22 146 lb 1.6 oz (66.3 kg)  02/07/22 145 lb 11.2 oz (66.1 kg)        Physical Exam Constitutional:      General: She is not in acute distress.    Appearance: Normal appearance. She is not ill-appearing.  Pulmonary:     Effort: Pulmonary effort is normal. No respiratory distress.  Neurological:     Mental Status: She is alert and oriented to person, place, and time.  Psychiatric:        Attention and Perception: Attention normal.        Mood and Affect: Mood is not anxious or depressed.        Speech: Speech normal.        Behavior: Behavior normal. Behavior is cooperative.        Assessment & Plan     Problem List Items Addressed This Visit       Other   Anxiety - Primary      Generalized Anxiety Disorder Chronic anxiety, well-controlled with Lexapro 20 mg daily for 12 years. Xanax 0.25 mg is used infrequently, approximately once every three months, primarily around her menstrual cycle. She reports no concerns with current management and prefers telemedicine follow-up for six-month check-ins due to the controlled nature of Xanax use. Chronic, stable  - Prescribe Lexapro 20 mg daily. - Prescribe Xanax 0.25 mg as needed for anxiety. - Schedule telemedicine follow-up in six months to monitor anxiety medication use.      - Schedule annual physical examination for August 12, 2023.      No follow-ups on file.     I discussed the assessment and treatment plan with the patient.  The patient was provided an opportunity to ask questions and all were answered. The patient agreed with the plan and demonstrated an understanding of the instructions.   The patient was advised to call back or seek an in-person evaluation if the symptoms worsen or if the condition fails to improve as anticipated.  I provided 15 minutes of non-face-to-face time during this encounter.   Ronnald Ramp, MD [facility] [phone] (phone) [phone] (fax)  [facility]

## 2023-08-12 ENCOUNTER — Encounter: Payer: Self-pay | Admitting: Family Medicine

## 2023-08-25 ENCOUNTER — Encounter: Payer: Self-pay | Admitting: Family Medicine

## 2023-08-25 ENCOUNTER — Ambulatory Visit (INDEPENDENT_AMBULATORY_CARE_PROVIDER_SITE_OTHER): Admitting: Family Medicine

## 2023-08-25 VITALS — BP 116/82 | HR 75 | Ht 66.0 in | Wt 148.0 lb

## 2023-08-25 DIAGNOSIS — Z131 Encounter for screening for diabetes mellitus: Secondary | ICD-10-CM

## 2023-08-25 DIAGNOSIS — Z0000 Encounter for general adult medical examination without abnormal findings: Secondary | ICD-10-CM | POA: Insufficient documentation

## 2023-08-25 DIAGNOSIS — Z13220 Encounter for screening for lipoid disorders: Secondary | ICD-10-CM

## 2023-08-25 DIAGNOSIS — Z23 Encounter for immunization: Secondary | ICD-10-CM | POA: Diagnosis not present

## 2023-08-25 DIAGNOSIS — N946 Dysmenorrhea, unspecified: Secondary | ICD-10-CM

## 2023-08-25 DIAGNOSIS — Z9229 Personal history of other drug therapy: Secondary | ICD-10-CM

## 2023-08-25 DIAGNOSIS — F419 Anxiety disorder, unspecified: Secondary | ICD-10-CM

## 2023-08-25 DIAGNOSIS — N921 Excessive and frequent menstruation with irregular cycle: Secondary | ICD-10-CM

## 2023-08-25 DIAGNOSIS — Z1231 Encounter for screening mammogram for malignant neoplasm of breast: Secondary | ICD-10-CM

## 2023-08-25 DIAGNOSIS — Z130 Encounter for screening for diseases of the blood and blood-forming organs and certain disorders involving the immune mechanism: Secondary | ICD-10-CM

## 2023-08-25 NOTE — Progress Notes (Signed)
 Complete physical exam   Patient: Alisha Hamilton   DOB: Jan 01, 1976   48 y.o. Female  MRN: [id number] Visit Date: 08/25/2023  Today's healthcare provider: Rockie Agent, MD   Chief Complaint  Patient presents with   Annual Exam   Care Management    Pattern of eating:General   Sleep:Normal  Are you exercising:Yes  What type of exercising: Walking  How long:3 miles  How frequent: 5days a week   Vaccine: Agreed     Subjective    Alisha Hamilton is a 48 y.o. female who presents today for a complete physical exam.    She does not have additional problems to discuss today.   Discussed the use of AI scribe software for clinical note transcription with the patient, who gave verbal consent to proceed.  History of Present Illness Alisha Hamilton is a 48 year old female who presents for an annual physical exam.  She has no acute complaints and no symptoms suggestive of tuberculosis, answering 'no' to all TB-related questions. She prefers to have her mammogram done at a hospital closer to her residence in [place].  She maintains a general diet and exercises regularly by walking three miles five days a week. She is up to date with her tetanus vaccine, having received it in 2023, and plans to get the COVID vaccine at a pharmacy as it is not available at the clinic.  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 She is nervous about TB testing despite having no symptoms.  She has a history of a large fibroid, which was removed using robotic surgery, leaving her with four scars. She is able to lift and carry items as needed post-surgery.  She is currently prescribed alprazolam  (Xanax ) for anxiety but states she has plenty and does not need a refill at this time.   Physical form was completed and returned to patient during today's visit   She responded no to all TB screening questions so no PPD  test was administered nor recommended today   Past Medical History:  Diagnosis Date   Anxiety    Complication of anesthesia    felt like skin was crawling after D&C, needed benadryl   Menorrhagia with irregular cycle    Migraines    Night sweats    Uterine leiomyoma    Past Surgical History:  Procedure Laterality Date   DILATION AND CURETTAGE OF UTERUS  02/25/2003   LEEP  02/24/2009   OTHER SURGICAL HISTORY     clips put on nerves 2006 (facial)   ROBOTIC ASSISTED LAPAROSCOPIC HYSTERECTOMY AND SALPINGECTOMY Bilateral 01/10/2021   Procedure: XI ROBOTIC ASSISTED LAPAROSCOPIC HYSTERECTOMY AND SALPINGECTOMY; REMOVAL OF IUD; CYSTOSCOPY;  Surgeon: Leonce Garnette BIRCH, MD;  Location: [facility] ORS;  Service: Gynecology;  Laterality: Bilateral;   Social History   Socioeconomic History   Marital status: Divorced    Spouse name: Not on file   Number of children: 2   Years of education: Not on file   Highest education level: Not on file  Occupational History   Not on file  Tobacco Use   Smoking status: Never   Smokeless tobacco: Never  Vaping Use   Vaping status: Never Used  Substance and Sexual Activity   Alcohol use: Yes    Comment: occassion   Drug use: No   Sexual activity: Yes    Birth control/protection: None  Other Topics Concern   Not on file  Social History Narrative   Not on file   Social Drivers of Health   Financial Resource Strain: Low Risk  (08/25/2023)   Overall Financial Resource Strain (CARDIA)    Difficulty of Paying Living Expenses: Not hard at all  Food Insecurity: No Food Insecurity (08/25/2023)   Hunger Vital Sign    Worried About Running Out of Food in the Last Year: Never true    Ran Out of Food in the Last Year: Never true  Transportation Needs: No Transportation Needs (08/25/2023)   PRAPARE - Administrator, Civil Service (Medical): No    Lack of Transportation (Non-Medical): No  Physical Activity: Not on file  Stress: No Stress Concern  Present (08/25/2023)   [employer] of Occupational Health - Occupational Stress Questionnaire    Feeling of Stress: Not at all  Social Connections: Not on file  Intimate Partner Violence: Not At Risk (08/25/2023)   Humiliation, Afraid, Rape, and Kick questionnaire    Fear of Current or Ex-Partner: No    Emotionally Abused: No    Physically Abused: No    Sexually Abused: No   Family Status  Relation Name Status   Mother  Alive   Father  Deceased   Sister  Alive   Brother  Alive   Daughter  Alive   Daughter  Alive   Neg Hx  (Not Specified)  No partnership data on file   Family History  Problem Relation Age of Onset   Diabetes Father    Anxiety disorder Sister    Irritable bowel syndrome Sister    Alcohol abuse Brother    Hypertension Brother    Hyperlipidemia Brother    Anxiety disorder Brother    Breast cancer Neg Hx    No Known Allergies   Medications: Outpatient Medications Prior to Visit  Medication Sig   ALPRAZolam  (XANAX ) 0.5 MG tablet TAKE 1/2 TO 1 TABLET(0.25 TO 0.5 MG) BY MOUTH DAILY AS NEEDED FOR ANXIETY   escitalopram  (LEXAPRO ) 20 MG tablet TAKE 1 TABLET BY MOUTH DAILY   Ferrous Sulfate (IRON PO) Take 1 tablet by mouth daily.   ibuprofen  (ADVIL ) 800 MG tablet Take 1 tablet (800 mg total) by mouth every 8 (eight) hours.   No facility-administered medications prior to visit.    Review of Systems  Last CBC Lab Results  Component Value Date   WBC 5.3 02/14/2022   HGB 13.0 02/14/2022   HCT 40.9 02/14/2022   MCV 90 02/14/2022   MCH 28.6 02/14/2022   RDW 11.9 02/14/2022   PLT 225 02/14/2022   Last metabolic panel Lab Results  Component Value Date   GLUCOSE 76 02/14/2022   NA 140 02/14/2022   K 4.2 02/14/2022   CL 104 02/14/2022   CO2 22 02/14/2022   BUN 11 02/14/2022   CREATININE 0.72 02/14/2022   EGFR 105 02/14/2022   CALCIUM 8.7 02/14/2022   PROT 6.5 02/14/2022   ALBUMIN 4.4 02/14/2022   LABGLOB 2.1 02/14/2022   AGRATIO 2.1 02/14/2022    BILITOT 1.6 (H) 02/14/2022   ALKPHOS 63 02/14/2022   AST 12 02/14/2022   ALT 9 02/14/2022   ANIONGAP 4 (L) 01/04/2021   Last lipids Lab Results  Component Value Date   CHOL 148 02/14/2022   HDL 54 02/14/2022   LDLCALC 81 02/14/2022   TRIG 64 02/14/2022   CHOLHDL 2.7 02/14/2022   Last hemoglobin A1c Lab Results  Component Value Date  HGBA1C 5.2 02/14/2022   Last thyroid functions Lab Results  Component Value Date   TSH 1.210 02/14/2022   Last vitamin D No results found for: 25OHVITD2, 25OHVITD3, VD25OH Last vitamin B12 and Folate No results found for: VITAMINB12, FOLATE     Objective    BP 116/82   Pulse 75   Ht 5' 6 (1.676 m)   Wt 148 lb (67.1 kg)   LMP 01/09/2021 Comment: IUD removed during surgical procedure 01/10/2021 with Dr. CANDIE Mace.  SpO2 100%   BMI 23.89 kg/m  BP Readings from Last 3 Encounters:  08/25/23 116/82  08/15/22 113/81  07/23/22 127/88   Wt Readings from Last 3 Encounters:  08/25/23 148 lb (67.1 kg)  08/15/22 146 lb 6.4 oz (66.4 kg)  07/23/22 146 lb 1.6 oz (66.3 kg)        Physical Exam Vitals reviewed.  Constitutional:      General: She is not in acute distress.    Appearance: Normal appearance. She is not ill-appearing, toxic-appearing or diaphoretic.  HENT:     Head: Normocephalic and atraumatic.     Right Ear: Tympanic membrane and external ear normal. There is no impacted cerumen.     Left Ear: Tympanic membrane and external ear normal. There is no impacted cerumen.     Nose: Nose normal.     Mouth/Throat:     Pharynx: Oropharynx is clear.   Eyes:     General: No scleral icterus.    Extraocular Movements: Extraocular movements intact.     Conjunctiva/sclera: Conjunctivae normal.     Pupils: Pupils are equal, round, and reactive to light.    Cardiovascular:     Rate and Rhythm: Normal rate and regular rhythm.     Pulses: Normal pulses.     Heart sounds: Normal heart sounds. No murmur heard.    No  friction rub. No gallop.  Pulmonary:     Effort: Pulmonary effort is normal. No respiratory distress.     Breath sounds: Normal breath sounds. No wheezing, rhonchi or rales.  Abdominal:     General: Bowel sounds are normal. There is no distension.     Palpations: Abdomen is soft. There is no mass.     Tenderness: There is no abdominal tenderness. There is no guarding.   Musculoskeletal:        General: No deformity.     Cervical back: Normal range of motion and neck supple.     Right lower leg: No edema.     Left lower leg: No edema.  Lymphadenopathy:     Cervical: No cervical adenopathy.   Skin:    General: Skin is warm.     Capillary Refill: Capillary refill takes less than 2 seconds.     Findings: No erythema or rash.   Neurological:     General: No focal deficit present.     Mental Status: She is alert and oriented to person, place, and time.     Cranial Nerves: Cranial nerves 2-12 are intact. No cranial nerve deficit or facial asymmetry.     Motor: Motor function is intact. No weakness.     Gait: Gait normal.   Psychiatric:        Mood and Affect: Mood normal.        Behavior: Behavior normal.       Last depression screening scores    08/15/2022    3:47 PM 07/23/2022    4:05 PM 07/24/2021    4:14 PM  PHQ 2/9 Scores  PHQ - 2 Score 0 0 0  PHQ- 9 Score 0 0 0    Last fall risk screening    08/15/2022    3:47 PM  Fall Risk   Falls in the past year? 0  Number falls in past yr: 0  Injury with Fall? 0  Risk for fall due to : No Fall Risks  Follow up Falls evaluation completed    Last Audit-C alcohol use screening    08/25/2023   10:31 AM  Alcohol Use Disorder Test (AUDIT)  1. How often do you have a drink containing alcohol? 0  2. How many drinks containing alcohol do you have on a typical day when you are drinking? 0  3. How often do you have six or more drinks on one occasion? 0  AUDIT-C Score 0   A score of 3 or more in women, and 4 or more in men  indicates increased risk for alcohol abuse, EXCEPT if all of the points are from question 1   No results found for any visits on 08/25/23.  Assessment & Plan    Routine Health Maintenance and Physical Exam  Immunization History  Administered Date(s) Administered   Hepb-cpg 08/25/2023   Influenza Inj Mdck Quad Pf 12/16/2017   Influenza,inj,Quad PF,6+ Mos 12/08/2016, 02/07/2022   Influenza-Unspecified 12/14/2019   Moderna Sars-Covid-2 Vaccination 04/23/2019, 05/21/2019   Pfizer Covid-19 Vaccine Bivalent Booster 3yrs & up 02/07/2022   Td 07/24/2021   Tdap 05/16/2010    Health Maintenance  Topic Date Due   COVID-19 Vaccine (4 - 2024-25 season) 10/26/2022   MAMMOGRAM  08/06/2023   Hepatitis B Vaccines (2 of 2 - CpG 2-dose series) 09/22/2023   INFLUENZA VACCINE  09/25/2023   Fecal DNA (Cologuard)  02/25/2025   DTaP/Tdap/Td (3 - Td or Tdap) 07/25/2031   Hepatitis C Screening  Completed   HIV Screening  Completed   HPV VACCINES  Aged Out   Meningococcal B Vaccine  Aged Out    Problem List Items Addressed This Visit       Genitourinary   Dysmenorrhea     Other   Menorrhagia with irregular cycle   Disorder of bilirubin excretion   Relevant Orders   CMP14+EGFR   Anxiety    Chronic Anxiety Anxiety managed with alprazolam  (Xanax ). Current supply is sufficient, no refill needed. Follow-up appointment scheduled for January via televisit. - Schedule follow-up appointment in January via televisit      Annual physical exam - Primary   Annual Physical Examination  Tuberculosis testing not required as screening questions were negative. - Perform vision test - Order mammogram for breast cancer screening - Order A1c, metabolic panel, CBC, and cholesterol panel - Follow up on bilirubin levels - recommended well balanced diet and continued physical activity  Exercises regularly by walking three miles five days a week. Hepatitis B vaccine to be administered today. COVID-19 vaccine  to be obtained at pharmacy. Tetanus vaccination confirmed up to date. - Administered hepatitis B vaccine, dose #1 - Advise to obtain COVID-19 vaccine at pharmacy      Other Visit Diagnoses       Screening for deficiency anemia       Relevant Orders   CBC     Screening for diabetes mellitus       Relevant Orders   Hemoglobin A1c     Screening for lipid disorders       Relevant Orders   Lipid panel  Encounter for screening mammogram for malignant neoplasm of breast       Relevant Orders   MM 3D SCREENING MAMMOGRAM BILATERAL BREAST     Has received first dose of hepatitis B vaccine       Relevant Orders   Heplisav-B (HepB-CPG) Vaccine (Completed)       Assessment & Plan           Return in about 5 months (around 01/25/2024) for CHRONIC F/U.       Rockie Agent, MD  [facility] [phone] (phone) [phone] (fax)  [facility]

## 2023-08-25 NOTE — Assessment & Plan Note (Signed)
  Chronic Anxiety Anxiety managed with alprazolam  (Xanax ). Current supply is sufficient, no refill needed. Follow-up appointment scheduled for January via televisit. - Schedule follow-up appointment in January via televisit

## 2023-08-25 NOTE — Assessment & Plan Note (Addendum)
 Annual Physical Examination  Tuberculosis testing not required as screening questions were negative. - Perform vision test - Order mammogram for breast cancer screening - Order A1c, metabolic panel, CBC, and cholesterol panel - Follow up on bilirubin levels - recommended well balanced diet and continued physical activity  Exercises regularly by walking three miles five days a week. Hepatitis B vaccine to be administered today. COVID-19 vaccine to be obtained at pharmacy. Tetanus vaccination confirmed up to date. - Administered hepatitis B vaccine, dose #1 - Advise to obtain COVID-19 vaccine at pharmacy

## 2023-08-26 ENCOUNTER — Ambulatory Visit: Payer: Self-pay | Admitting: Family Medicine

## 2023-08-26 LAB — LIPID PANEL
Chol/HDL Ratio: 3.3 ratio (ref 0.0–4.4)
Cholesterol, Total: 176 mg/dL (ref 100–199)
HDL: 54 mg/dL (ref >39)
LDL Chol Calc (NIH): 108 mg/dL — ABNORMAL HIGH (ref 0–99)
Triglycerides: 77 mg/dL (ref 0–149)
VLDL Cholesterol Cal: 14 mg/dL (ref 5–40)

## 2023-08-26 LAB — CMP14+EGFR
ALT: 14 IU/L (ref 0–32)
AST: 17 IU/L (ref 0–40)
Albumin: 4.6 g/dL (ref 3.9–4.9)
Alkaline Phosphatase: 77 IU/L (ref 44–121)
BUN/Creatinine Ratio: 13 (ref 9–23)
BUN: 9 mg/dL (ref 6–24)
Bilirubin Total: 1.4 mg/dL — ABNORMAL HIGH (ref 0.0–1.2)
CO2: 23 mmol/L (ref 20–29)
Calcium: 9.6 mg/dL (ref 8.7–10.2)
Chloride: 103 mmol/L (ref 96–106)
Creatinine, Ser: 0.67 mg/dL (ref 0.57–1.00)
Globulin, Total: 2.4 g/dL (ref 1.5–4.5)
Glucose: 66 mg/dL — ABNORMAL LOW (ref 70–99)
Potassium: 4.5 mmol/L (ref 3.5–5.2)
Sodium: 141 mmol/L (ref 134–144)
Total Protein: 7 g/dL (ref 6.0–8.5)
eGFR: 108 mL/min/{1.73_m2} (ref >59)

## 2023-08-26 LAB — CBC
Hematocrit: 41.3 % (ref 34.0–46.6)
Hemoglobin: 13.6 g/dL (ref 11.1–15.9)
MCH: 29.8 pg (ref 26.6–33.0)
MCHC: 32.9 g/dL (ref 31.5–35.7)
MCV: 91 fL (ref 79–97)
Platelets: 210 10*3/uL (ref 150–450)
RBC: 4.56 x10E6/uL (ref 3.77–5.28)
RDW: 11.9 % (ref 11.7–15.4)
WBC: 5.7 10*3/uL (ref 3.4–10.8)

## 2023-08-26 LAB — HEMOGLOBIN A1C
Est. average glucose Bld gHb Est-mCnc: 105 mg/dL
Hgb A1c MFr Bld: 5.3 % (ref 4.8–5.6)

## 2023-09-07 ENCOUNTER — Encounter: Payer: Self-pay | Admitting: Family Medicine

## 2023-09-17 ENCOUNTER — Inpatient Hospital Stay: Admission: RE | Admit: 2023-09-17 | Source: Ambulatory Visit

## 2023-10-07 ENCOUNTER — Other Ambulatory Visit: Payer: Self-pay | Admitting: Medical Genetics

## 2023-10-08 ENCOUNTER — Other Ambulatory Visit
Admission: RE | Admit: 2023-10-08 | Discharge: 2023-10-08 | Disposition: A | Discharge status: Home / Self Care | Payer: Self-pay | Source: Ambulatory Visit | Attending: Medical Genetics | Admitting: Medical Genetics

## 2023-10-19 LAB — GENECONNECT MOLECULAR SCREEN: Genetic Analysis Overall Interpretation: NEGATIVE

## 2023-10-27 ENCOUNTER — Ambulatory Visit
Admission: RE | Admit: 2023-10-27 | Discharge: 2023-10-27 | Disposition: A | Discharge status: Home / Self Care | Source: Ambulatory Visit | Attending: Family Medicine | Admitting: Family Medicine

## 2023-10-27 DIAGNOSIS — Z1231 Encounter for screening mammogram for malignant neoplasm of breast: Secondary | ICD-10-CM | POA: Diagnosis present

## 2023-11-17 ENCOUNTER — Encounter: Payer: Self-pay | Admitting: Family Medicine

## 2023-11-17 ENCOUNTER — Telehealth (INDEPENDENT_AMBULATORY_CARE_PROVIDER_SITE_OTHER): Admitting: Family Medicine

## 2023-11-17 DIAGNOSIS — G479 Sleep disorder, unspecified: Secondary | ICD-10-CM

## 2023-11-17 DIAGNOSIS — M2550 Pain in unspecified joint: Secondary | ICD-10-CM | POA: Diagnosis not present

## 2023-11-17 DIAGNOSIS — N951 Menopausal and female climacteric states: Secondary | ICD-10-CM

## 2023-11-17 DIAGNOSIS — R4789 Other speech disturbances: Secondary | ICD-10-CM

## 2023-11-17 NOTE — Progress Notes (Signed)
 MyChart Video Visit    Virtual Visit via Video Note   This format is felt to be most appropriate for this patient at this time. Physical exam was limited by quality of the video and audio technology used for the visit.   Patient location: Patient's home address   Provider location: [facility]  [address]  [place], [place] [address]   I discussed the limitations of evaluation and management by telemedicine and the availability of in person appointments. The patient expressed understanding and agreed to proceed.  Patient: Alisha Hamilton   DOB: [birth year]   48 y.o. Female  MRN: [id number] Visit Date: 11/17/2023  Today's healthcare provider: Rockie Agent, MD   No chief complaint on file.  Subjective    HPI   Discussed the use of AI scribe software for clinical note transcription with the patient, who gave verbal consent to proceed.  History of Present Illness Alisha Hamilton is a 48 year old female who presents with concerns of perimenopausal symptoms.  She experiences sleep disturbances, waking up nightly between 2:30 and 3:30 AM and having difficulty falling back asleep, taking about an hour to do so. She also reports hot flashes and joint pain, particularly after her 40-minute commute, which makes it painful to stand up. Additionally, she has noticed weight gain and occasional difficulty with word retrieval, describing it as 'can you just, um, hand me that, that thing over there, you know, that thing.'  She does not have a uterus but retains her ovaries, which is relevant to her current symptoms. Her current medications include Lexapro  20 mg daily for anxiety, Xanax  0.5 mg as needed for anxiety, and ferrous sulfate for a history of iron deficiency anemia.  No chest pain, shortness of breath, or palpitations.      Past Medical History:  Diagnosis Date   Anxiety    Complication of anesthesia    felt like skin was crawling after  D&C, needed benadryl   Menorrhagia with irregular cycle    Migraines    Night sweats    Uterine leiomyoma        08/15/2022    3:47 PM 07/23/2022    4:05 PM 07/24/2021    4:14 PM  PHQ9 SCORE ONLY  PHQ-9 Total Score 0  0  0      Data saved with a previous flowsheet row definition       02/07/2022    3:49 PM 07/24/2021    4:14 PM 04/26/2020    4:11 PM 06/01/2019    2:00 PM  GAD 7 : Generalized Anxiety Score  Nervous, Anxious, on Edge 0 0 0 0  Control/stop worrying 0 0 0 0  Worry too much - different things 0 0 0 0  Trouble relaxing 0 1 0 0  Restless 0 0 0 0  Easily annoyed or irritable 0 0 0 0  Afraid - awful might happen 0 0 0 0  Total GAD 7 Score 0 1 0 0  Anxiety Difficulty Not difficult at all Not difficult at all Not difficult at all Not difficult at all     Medications: Outpatient Medications Prior to Visit  Medication Sig   ALPRAZolam  (XANAX ) 0.5 MG tablet TAKE 1/2 TO 1 TABLET(0.25 TO 0.5 MG) BY MOUTH DAILY AS NEEDED FOR ANXIETY   escitalopram  (LEXAPRO ) 20 MG tablet TAKE 1 TABLET BY MOUTH DAILY   Ferrous Sulfate (IRON PO) Take 1 tablet by mouth daily.   [DISCONTINUED]  ibuprofen  (ADVIL ) 800 MG tablet Take 1 tablet (800 mg total) by mouth every 8 (eight) hours.   No facility-administered medications prior to visit.    Review of Systems  Last CBC Lab Results  Component Value Date   WBC 5.7 08/25/2023   HGB 13.6 08/25/2023   HCT 41.3 08/25/2023   MCV 91 08/25/2023   MCH 29.8 08/25/2023   RDW 11.9 08/25/2023   PLT 210 08/25/2023   Last metabolic panel Lab Results  Component Value Date   GLUCOSE 66 (L) 08/25/2023   NA 141 08/25/2023   K 4.5 08/25/2023   CL 103 08/25/2023   CO2 23 08/25/2023   BUN 9 08/25/2023   CREATININE 0.67 08/25/2023   EGFR 108 08/25/2023   CALCIUM 9.6 08/25/2023   PROT 7.0 08/25/2023   ALBUMIN 4.6 08/25/2023   LABGLOB 2.4 08/25/2023   AGRATIO 2.1 02/14/2022   BILITOT 1.4 (H) 08/25/2023   ALKPHOS 77 08/25/2023   AST 17  08/25/2023   ALT 14 08/25/2023   ANIONGAP 4 (L) 01/04/2021   Last lipids Lab Results  Component Value Date   CHOL 176 08/25/2023   HDL 54 08/25/2023   LDLCALC 108 (H) 08/25/2023   TRIG 77 08/25/2023   CHOLHDL 3.3 08/25/2023   Last hemoglobin A1c Lab Results  Component Value Date   HGBA1C 5.3 08/25/2023   Last thyroid functions Lab Results  Component Value Date   TSH 1.210 02/14/2022        Objective    LMP 01/09/2021 Comment: IUD removed during surgical procedure 01/10/2021 with Dr. CANDIE Mace.  BP Readings from Last 3 Encounters:  08/25/23 116/82  08/15/22 113/81  07/23/22 127/88   Wt Readings from Last 3 Encounters:  08/25/23 148 lb (67.1 kg)  08/15/22 146 lb 6.4 oz (66.4 kg)  07/23/22 146 lb 1.6 oz (66.3 kg)        Physical Exam      Assessment & Plan     Problem List Items Addressed This Visit   None Visit Diagnoses       Perimenopausal symptoms    -  Primary     Sleep disturbance         Polyarthralgia         Vasomotor symptoms due to menopause         Word finding problem           Assessment and Plan Assessment & Plan Perimenopausal symptoms 49 year old female with symptoms consistent with perimenopause, including sleep disturbances, hot flashes, joint pain, weight gain, and cognitive difficulties. Symptoms began this summer. No uterus, but ovaries are intact. Clinical diagnosis based on symptoms rather than hormone levels. - Provide information on treatment options including gabapentin, clonidine, venlafaxine, Veozah, and hormone replacement therapy (estradiol  and progesterone). - Advise to review options and consult with friends who have experienced menopause. - Instruct to send a MyChart message with treatment preference. - Attach information handouts on menopause to her physical summary.  Anxiety disorder Currently managed with Lexapro  20 mg daily and Xanax  0.5 mg as needed. Potential cross-tapering to venlafaxine if chosen for  perimenopausal symptoms. - Continue Lexapro  20 mg daily. - Use Xanax  0.5 mg as needed for anxiety. - Consider cross-tapering to venlafaxine if chosen for perimenopausal symptoms.     No follow-ups on file.     I discussed the assessment and treatment plan with the patient. The patient was provided an opportunity to ask questions and all were answered. The patient agreed with  the plan and demonstrated an understanding of the instructions.   The patient was advised to call back or seek an in-person evaluation if the symptoms worsen or if the condition fails to improve as anticipated.  I provided 25 minutes of non-face-to-face time during this encounter.   Rockie Agent, MD [facility] [phone] (phone) [phone] (fax)  [facility]

## 2023-11-18 ENCOUNTER — Other Ambulatory Visit: Payer: Self-pay | Admitting: Family Medicine

## 2023-11-18 DIAGNOSIS — N951 Menopausal and female climacteric states: Secondary | ICD-10-CM

## 2023-11-18 MED ORDER — ESTRADIOL 0.025 MG/24HR TD PTWK: 0.0250 mg | MEDICATED_PATCH | TRANSDERMAL | 12 refills | Status: DC

## 2023-12-18 ENCOUNTER — Telehealth: Payer: Self-pay

## 2023-12-18 NOTE — Telephone Encounter (Signed)
 Please see mychart message.

## 2023-12-18 NOTE — Telephone Encounter (Signed)
 Copied from CRM [id number]. Topic: Clinical - Medical Advice >> Dec 18, 2023  3:05 PM Sophia H wrote: Reason for CRM: Patient states she has a large mass behind left nipple, started horomone replacement therapy recently & last mammogram was normal (September). Wants to know if she should stop the horomone replacement therapy or come in for an office visit. States is the size of a golf ball, appeared out of nowhere last weekend. No pain.   Sent my chart message yesterday, wants to speak with nurse available asap. # [phone]

## 2023-12-29 ENCOUNTER — Ambulatory Visit: Admitting: Family Medicine

## 2023-12-29 ENCOUNTER — Encounter: Payer: Self-pay | Admitting: Family Medicine

## 2023-12-29 VITALS — BP 115/81 | HR 71 | Ht 67.0 in | Wt 150.1 lb

## 2023-12-29 DIAGNOSIS — N6342 Unspecified lump in left breast, subareolar: Secondary | ICD-10-CM | POA: Diagnosis not present

## 2023-12-29 NOTE — Progress Notes (Signed)
 Acute visit   Patient: Alisha Hamilton   DOB: 1975-08-14   48 y.o. Female  MRN: [id number] PCP: Sharma Coyer, MD   Chief Complaint  Patient presents with   Acute Visit    Patient is present due to lump in her left breast. Patient reported she first noticed the weekend before 12/17/23 located behind her left nipple. She reported she had been taking HRT (estrogen patch) for a month when noticing. On 12/18/23 reported it is the size of a golf ball.   Breast Mass    Patient reports no changes since contacting the office. No pain, fluids, discharge, discoloration. She has since stopped the HRT. Mammogram was completed in September   Subjective    Discussed the use of AI scribe software for clinical note transcription with the patient, who gave verbal consent to proceed.  History of Present Illness   Alisha Hamilton is a 48 year old female who presents with a newly discovered lump in her left breast.  In late October, she discovered a lump in her left breast. She discontinued hormone replacement therapy three weeks ago. The lump is perceived as stable in size, mobile, located behind the nipple, and measures approximately four to five centimeters. There is no associated pain or nipple discharge. A screening mammogram in September showed no abnormalities. She has a history of breast cysts and often requires additional imaging after mammograms. Her boyfriend is concerned due to his mother's history of breast cancer.        Review of Systems  Objective    BP 115/81 (BP Location: Left Arm, Patient Position: Sitting, Cuff Size: Normal)   Pulse 71   Ht 5' 7 (1.702 m)   Wt 150 lb 1.6 oz (68.1 kg)   LMP 01/09/2021 Comment: IUD removed during surgical procedure 01/10/2021 with Dr. CANDIE Mace.  SpO2 100%   BMI 23.51 kg/m  Physical Exam   Physical Exam   BREAST: Breasts normal on examination, except Palpable mobile lump in left breast, 4-5 cm, behind nipple, not attached to  nipple. No axillary lymphadenopathy.        No results found for any visits on 12/29/23.  Assessment & Plan     Problem List Items Addressed This Visit   None Visit Diagnoses       Subareolar mass of left breast    -  Primary   Relevant Orders   MM 3D DIAGNOSTIC MAMMOGRAM UNILATERAL LEFT BREAST   US  LIMITED ULTRASOUND INCLUDING AXILLA LEFT BREAST           Lump in left breast, subareolar region Lump in the left breast, subareolar region, first noticed in late October. No associated pain or nipple discharge. No palpable lymphadenopathy in the axilla. The lump is mobile and approximately 4-5 cm in size. Differential diagnosis includes cysts or fibrous changes, common post-hormone replacement therapy, but malignancy must be ruled out. Previous screening mammogram in September did not show the lump, necessitating further imaging. - Ordered diagnostic mammogram and breast ultrasound, including axillary region, to evaluate the lump. - Coordinated imaging at the [facility] if possible, with a backup plan for [facility] if necessary. - Advised her to follow up if not contacted for scheduling by early next week.        Return if symptoms worsen or fail to improve.      Jon Eva, MD  [facility] [phone] (phone) [phone] (fax)  [facility]

## 2024-01-19 ENCOUNTER — Ambulatory Visit
Admission: RE | Admit: 2024-01-19 | Discharge: 2024-01-19 | Disposition: A | Discharge status: Home / Self Care | Source: Ambulatory Visit | Attending: Family Medicine | Admitting: Family Medicine

## 2024-01-19 DIAGNOSIS — N6342 Unspecified lump in left breast, subareolar: Secondary | ICD-10-CM | POA: Diagnosis present

## 2024-01-25 ENCOUNTER — Telehealth: Admitting: Family Medicine

## 2024-01-25 ENCOUNTER — Encounter: Payer: Self-pay | Admitting: Family Medicine

## 2024-01-25 DIAGNOSIS — N951 Menopausal and female climacteric states: Secondary | ICD-10-CM | POA: Diagnosis not present

## 2024-01-25 DIAGNOSIS — R61 Generalized hyperhidrosis: Secondary | ICD-10-CM | POA: Diagnosis not present

## 2024-01-25 DIAGNOSIS — M2550 Pain in unspecified joint: Secondary | ICD-10-CM | POA: Diagnosis not present

## 2024-01-25 DIAGNOSIS — F419 Anxiety disorder, unspecified: Secondary | ICD-10-CM

## 2024-01-25 MED ORDER — ESCITALOPRAM OXALATE 20 MG PO TABS: 20.0000 mg | ORAL_TABLET | Freq: Every day | ORAL | 2 refills | Status: AC

## 2024-01-25 MED ORDER — ESTRADIOL 0.025 MG/24HR TD PTWK: 0.0250 mg | MEDICATED_PATCH | TRANSDERMAL | Status: AC

## 2024-01-25 NOTE — Patient Instructions (Signed)
 To keep you healthy, please keep in mind the following health maintenance items that you are due for:   Health Maintenance Due  Topic Date Due   Hepatitis B Vaccines 19-59 Average Risk (2 of 2 - CpG 2-dose series) 09/22/2023     Best Wishes,   Dr. Lang

## 2024-01-25 NOTE — Progress Notes (Unsigned)
 MyChart Video Visit    Virtual Visit via Video Note   This format is felt to be most appropriate for this patient at this time. Physical exam was limited by quality of the video and audio technology used for the visit.   Patient location: Patient's home address   Provider location: [facility]  [address]  [place], [place] [address]   I discussed the limitations of evaluation and management by telemedicine and the availability of in person appointments. The patient expressed understanding and agreed to proceed.  Patient: Alisha Hamilton   DOB: March 04, 1975   48 y.o. Female  MRN: [id number] Visit Date: 01/25/2024  Today's healthcare provider: Rockie Agent, MD   No chief complaint on file.  Subjective    HPI   Discussed the use of AI scribe software for clinical note transcription with the patient, who gave verbal consent to proceed.  History of Present Illness       Past Medical History:  Diagnosis Date   Anxiety    Complication of anesthesia    felt like skin was crawling after D&C, needed benadryl   Menorrhagia with irregular cycle    Migraines    Night sweats    Uterine leiomyoma        12/29/2023    2:55 PM 08/15/2022    3:47 PM 07/23/2022    4:05 PM  PHQ9 SCORE ONLY  PHQ-9 Total Score 0 0  0      Data saved with a previous flowsheet row definition       12/29/2023    2:55 PM 02/07/2022    3:49 PM 07/24/2021    4:14 PM 04/26/2020    4:11 PM  GAD 7 : Generalized Anxiety Score  Nervous, Anxious, on Edge 0 0 0 0  Control/stop worrying 0 0 0 0  Worry too much - different things 0 0 0 0  Trouble relaxing 0 0 1 0  Restless 0 0 0 0  Easily annoyed or irritable 0 0 0 0  Afraid - awful might happen  0 0 0  Total GAD 7 Score  0 1 0  Anxiety Difficulty Not difficult at all Not difficult at all Not difficult at all Not difficult at all     Medications: Outpatient Medications Prior to Visit  Medication Sig    ALPRAZolam  (XANAX ) 0.5 MG tablet TAKE 1/2 TO 1 TABLET(0.25 TO 0.5 MG) BY MOUTH DAILY AS NEEDED FOR ANXIETY   escitalopram  (LEXAPRO ) 20 MG tablet TAKE 1 TABLET BY MOUTH DAILY   estradiol  (CLIMARA ) 0.025 mg/24hr patch Place 1 patch (0.025 mg total) onto the skin once a week. (Patient not taking: Reported on 12/29/2023)   Ferrous Sulfate (IRON PO) Take 1 tablet by mouth daily.   No facility-administered medications prior to visit.    Review of Systems  {Insert previous labs (optional):23779} {See past labs  Heme  Chem  Endocrine  Serology  Results Review (optional):1}   Objective    LMP 01/09/2021 Comment: IUD removed during surgical procedure 01/10/2021 with Dr. CANDIE Mace.  {Insert last BP/Wt (optional):23777}{See vitals history (optional):1}    Physical Exam      Assessment & Plan     Problem List Items Addressed This Visit   None   Assessment and Plan Assessment & Plan      No follow-ups on file.     I discussed the assessment and treatment plan with the patient. The patient was provided an opportunity  to ask questions and all were answered. The patient agreed with the plan and demonstrated an understanding of the instructions.   The patient was advised to call back or seek an in-person evaluation if the symptoms worsen or if the condition fails to improve as anticipated.  I provided *** minutes of non-face-to-face time during this encounter.   Rockie Agent, MD [facility] [phone] (phone) [phone] (fax)  [facility]

## 2024-01-28 ENCOUNTER — Telehealth: Payer: Self-pay | Admitting: Family Medicine

## 2024-01-28 NOTE — Telephone Encounter (Signed)
 I contacted Alisha Hamilton and left her a voicemail requesting that she return my call to schedule her physical for next year on 08/25/2024.

## 2024-01-28 NOTE — Progress Notes (Signed)
 LVM requesting Alisha Hamilton to call back to schedule her physical
